# Patient Record
Sex: Female | Born: 1990 | Race: Black or African American | Hispanic: No | Marital: Single | Smoking: Current some day smoker
Health system: Southern US, Community
[De-identification: ages and names within clinical notes are randomized; demographics above are authoritative.]

## PROBLEM LIST (undated history)

## (undated) DIAGNOSIS — K219 Gastro-esophageal reflux disease without esophagitis: Secondary | ICD-10-CM

## (undated) DIAGNOSIS — K224 Dyskinesia of esophagus: Secondary | ICD-10-CM

## (undated) DIAGNOSIS — L309 Dermatitis, unspecified: Secondary | ICD-10-CM

---
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
## 2011-01-19 ENCOUNTER — Emergency Department (HOSPITAL_COMMUNITY)
Admission: EM | Admit: 2011-01-19 | Discharge: 2011-01-19 | Disposition: A | Discharge status: Home / Self Care | Payer: No Typology Code available for payment source | Attending: Emergency Medicine | Admitting: Emergency Medicine

## 2011-01-19 ENCOUNTER — Emergency Department (HOSPITAL_COMMUNITY): Payer: No Typology Code available for payment source

## 2011-01-19 DIAGNOSIS — R072 Precordial pain: Secondary | ICD-10-CM | POA: Insufficient documentation

## 2011-01-19 DIAGNOSIS — R0602 Shortness of breath: Secondary | ICD-10-CM | POA: Insufficient documentation

## 2012-11-02 ENCOUNTER — Encounter (HOSPITAL_COMMUNITY): Payer: Self-pay | Admitting: Emergency Medicine

## 2012-11-02 ENCOUNTER — Emergency Department (HOSPITAL_COMMUNITY)
Admission: EM | Admit: 2012-11-02 | Discharge: 2012-11-02 | Disposition: A | Discharge status: Home / Self Care | Payer: BC Managed Care – PPO | Source: Home / Self Care

## 2012-11-02 DIAGNOSIS — K224 Dyskinesia of esophagus: Secondary | ICD-10-CM

## 2012-11-02 DIAGNOSIS — Z3490 Encounter for supervision of normal pregnancy, unspecified, unspecified trimester: Secondary | ICD-10-CM

## 2012-11-02 DIAGNOSIS — O210 Mild hyperemesis gravidarum: Secondary | ICD-10-CM

## 2012-11-02 DIAGNOSIS — Z8719 Personal history of other diseases of the digestive system: Secondary | ICD-10-CM

## 2012-11-02 HISTORY — DX: Gastro-esophageal reflux disease without esophagitis: K21.9

## 2012-11-02 HISTORY — DX: Dyskinesia of esophagus: K22.4

## 2012-11-02 LAB — POCT URINALYSIS DIP (DEVICE)
Glucose, UA: NEGATIVE mg/dL
Specific Gravity, Urine: >=1.03 (ref 1.005–1.030)

## 2012-11-02 MED ORDER — PANTOPRAZOLE SODIUM 20 MG PO TBEC: 20.0000 mg | DELAYED_RELEASE_TABLET | Freq: Every day | ORAL | Status: DC

## 2012-11-02 MED ORDER — PRENATAL VITAMINS PLUS 27-1 MG PO TABS: 1.0000 | ORAL_TABLET | Freq: Every day | ORAL | Status: DC

## 2012-11-02 MED ORDER — ONDANSETRON HCL 4 MG PO TABS: 4.0000 mg | ORAL_TABLET | Freq: Four times a day (QID) | ORAL | Status: DC

## 2012-11-02 NOTE — ED Notes (Signed)
Pt c/o chest discomfort x6 weeks and abd pain x3 days She is [redacted] weeks pregnant Chest pain is intermittent; dx w/GERD and Esophagus spasms; given vicodin for the spasms and Prilosec for GERD Pain will last for few seconds  Abd pain is intermittent as well and will last for a few seconds Sx include: constipation x2 days when she'll usually have 2 bowel movements per day, vomiting, nauseas Has been able to keep Gatorade down  She is alert w/no signs of acute distress.

## 2012-11-02 NOTE — ED Provider Notes (Signed)
History     CSN: [id number]  Arrival date & time 11/02/12  1045   None     Chief Complaint  Patient presents with  . Chest Pain  . Abdominal Pain    (Consider location/radiation/quality/duration/timing/severity/associated sxs/prior treatment) HPI Comments: 22 year old female who is [redacted] weeks gestation and gravida 2 presents with nausea and vomiting for the past several days. She has had a poor intake of food but is able to drink most fluids. She does have nausea and she does not feel like drinking as much fluids as she knows she needs to. She is drinking Gatorade Pedialyte and other juices. She has a second complaint of substernal chest discomfort that is associated with her GERD. She was diagnosed with GERD 2 years ago with the same symptoms. She has not been taking her Prilosec for over 3 months. She denies shortness of breath or other associated symptoms. Her third complaint is a squeezing feeling in the upper lateral abdomen that is worse with vomiting. She has no pelvic pain, vaginal bleeding or discharge.   Past Medical History  Diagnosis Date  . Acid reflux   . Spasm of esophagus     History reviewed. No pertinent past surgical history.  No family history on file.  History  Substance Use Topics  . Smoking status: Never Smoker   . Smokeless tobacco: Not on file  . Alcohol Use: No    OB History    Grav Para Term Preterm Abortions TAB SAB Ect Mult Living                  Review of Systems  Constitutional: Positive for activity change. Negative for fever and fatigue.  HENT: Negative.   Respiratory: Negative.   Cardiovascular: Positive for chest pain.  Gastrointestinal: Positive for nausea, vomiting and constipation. Negative for diarrhea.       See history of present illness  Genitourinary: Negative.   Skin: Negative.   Neurological: Negative.     Allergies  Acrylic polymer; Naproxen; Penicillins; and Percocet  Home Medications   Current Outpatient Rx    Name  Route  Sig  Dispense  Refill  . VICODIN PO   Oral   Take by mouth.         Marland Kitchen PRILOSEC PO   Oral   Take by mouth.         . ONDANSETRON HCL 4 MG PO TABS   Oral   Take 1 tablet (4 mg total) by mouth every 6 (six) hours.   15 tablet   0   . PANTOPRAZOLE SODIUM 20 MG PO TBEC   Oral   Take 1 tablet (20 mg total) by mouth daily.   20 tablet   0   . PRENATAL VITAMINS PLUS 27-1 MG PO TABS   Oral   Take 1 tablet by mouth daily. 1 tab po once daily   30 tablet   1     BP 121/83  Pulse 85  Temp 98.1 F (36.7 C) (Oral)  Resp 16  SpO2 99%  LMP 09/20/2012  Physical Exam  Nursing note and vitals reviewed. Constitutional: She is oriented to person, place, and time. She appears well-developed and well-nourished. No distress.  Neck: Normal range of motion. Neck supple.  Cardiovascular: Normal rate, regular rhythm and normal heart sounds.        Split S2  Pulmonary/Chest: Effort normal and breath sounds normal. No respiratory distress. She has no wheezes. She has no rales.  No chest wall tenderness  Abdominal: Soft. Bowel sounds are normal. She exhibits no distension and no mass. There is no tenderness. There is no rebound and no guarding.  Musculoskeletal: Normal range of motion. She exhibits no edema.  Lymphadenopathy:    She has no cervical adenopathy.  Neurological: She is alert and oriented to person, place, and time. She exhibits normal muscle tone.  Skin: Skin is warm and dry. No rash noted.  Psychiatric: She has a normal mood and affect.    ED Course  Procedures (including critical care time)  Labs Reviewed  POCT URINALYSIS DIP (DEVICE) - Abnormal; Notable for the following:    Bilirubin Urine SMALL (*)     Ketones, ur TRACE (*)     Hgb urine dipstick MODERATE (*)     Protein, ur 30 (*)     Urobilinogen, UA 2.0 (*)     All other components within normal limits   No results found.   1. Esophageal spasm   2. H/O esophageal spasm   3.  Pregnancy   4. Hyperemesis gravidarum, mild, before 23rd week       MDM  Zofran 4 mg every 6 hours when necessary nausea Stop the Prilosec and start protonix 20 mg daily for esophageal spasms Prenatal vitamins one daily If not much better within 48 hours and  nausea and vomiting has not improved and  are unable to maintain your fluid intake you will need to [facility] for possible IV fluids. Force fluids as directed.        Hayden Rasmussen, NP 11/02/12 1408

## 2012-11-02 NOTE — ED Provider Notes (Signed)
Medical screening examination/treatment/procedure(s) were performed by resident physician or non-physician practitioner and as supervising physician I was immediately available for consultation/collaboration.   Barkley Bruns MD.    Linna Hoff, MD 11/02/12 2029

## 2013-02-20 IMAGING — CR DG CHEST 2V
2 series · 2 of 2 positions shown · non-contrast
Comparison: None.

CLINICAL DATA: Chest pain

CHEST - 2 VIEW

[w chest pa]
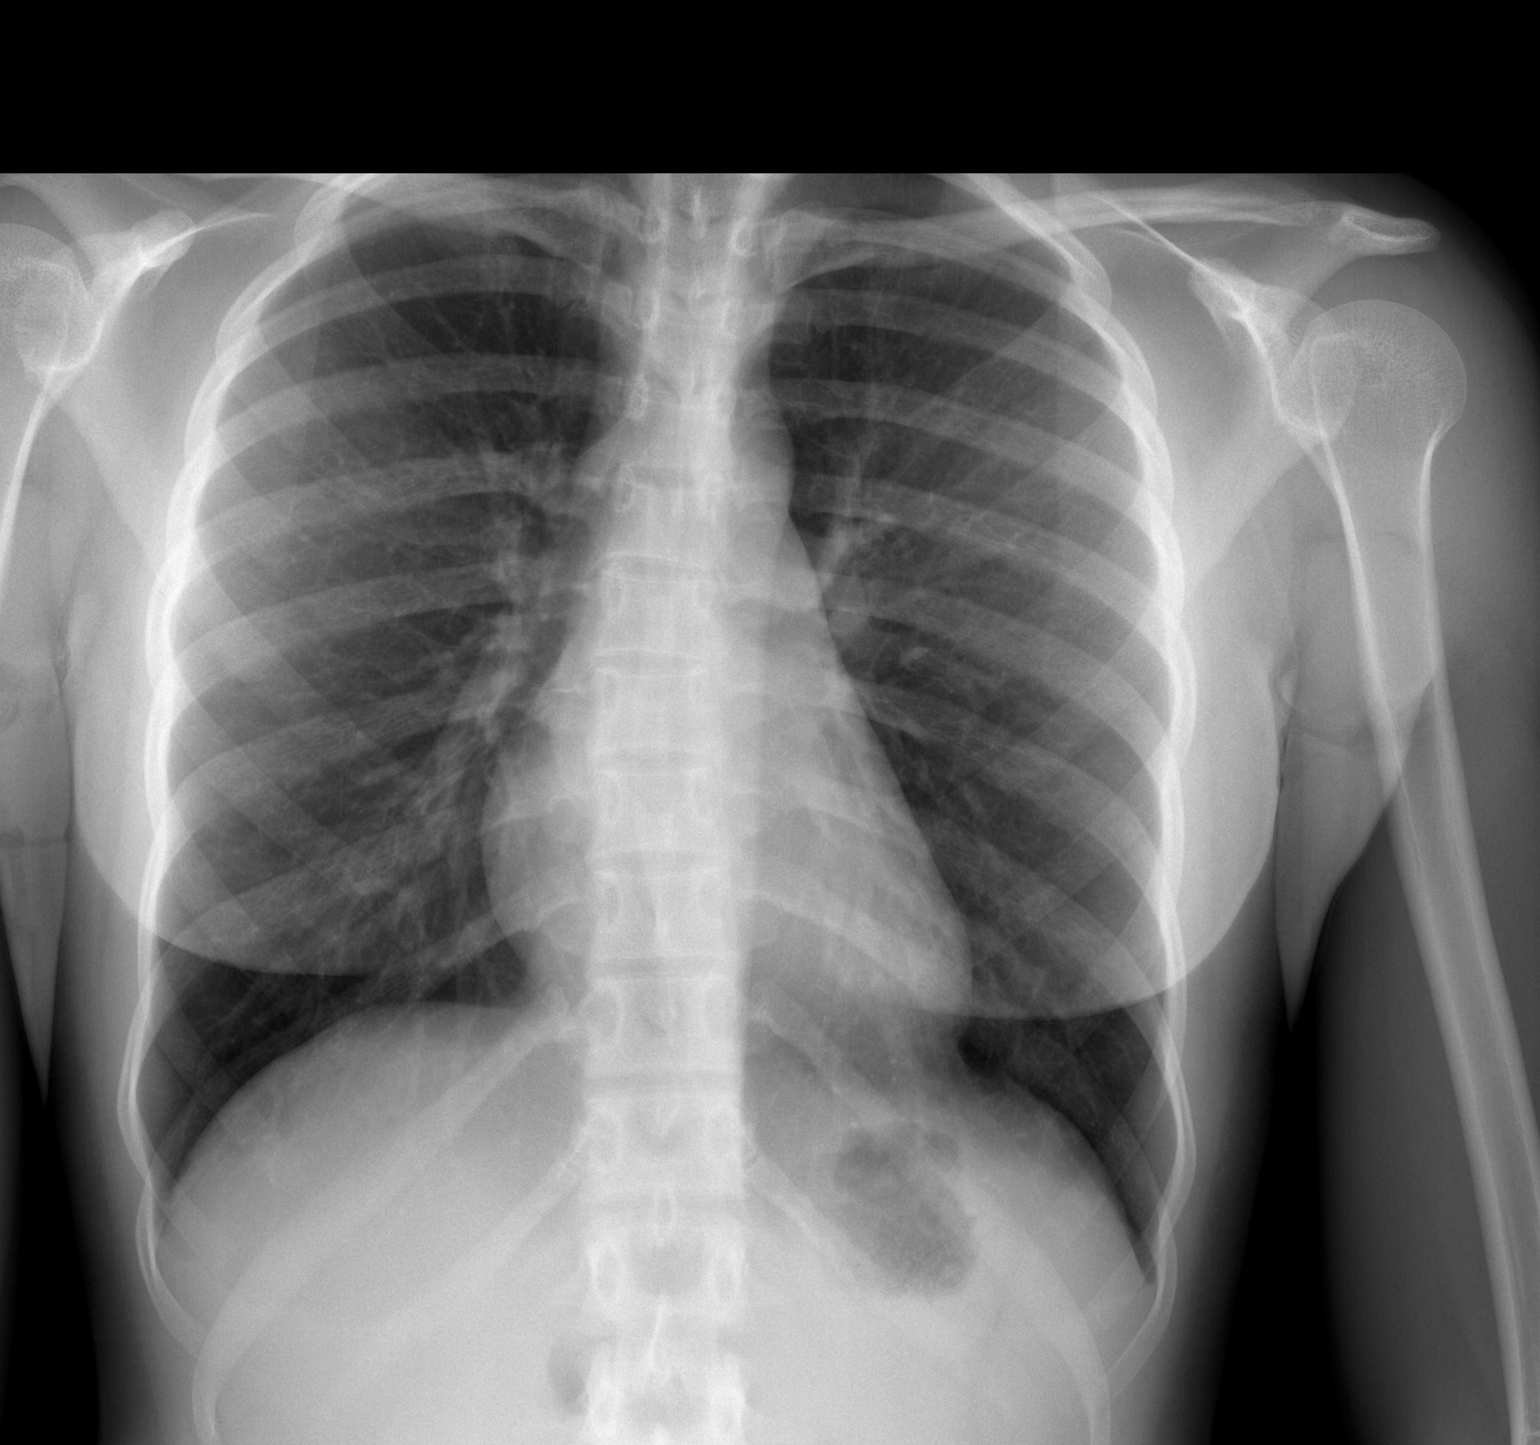

[w chest lat]
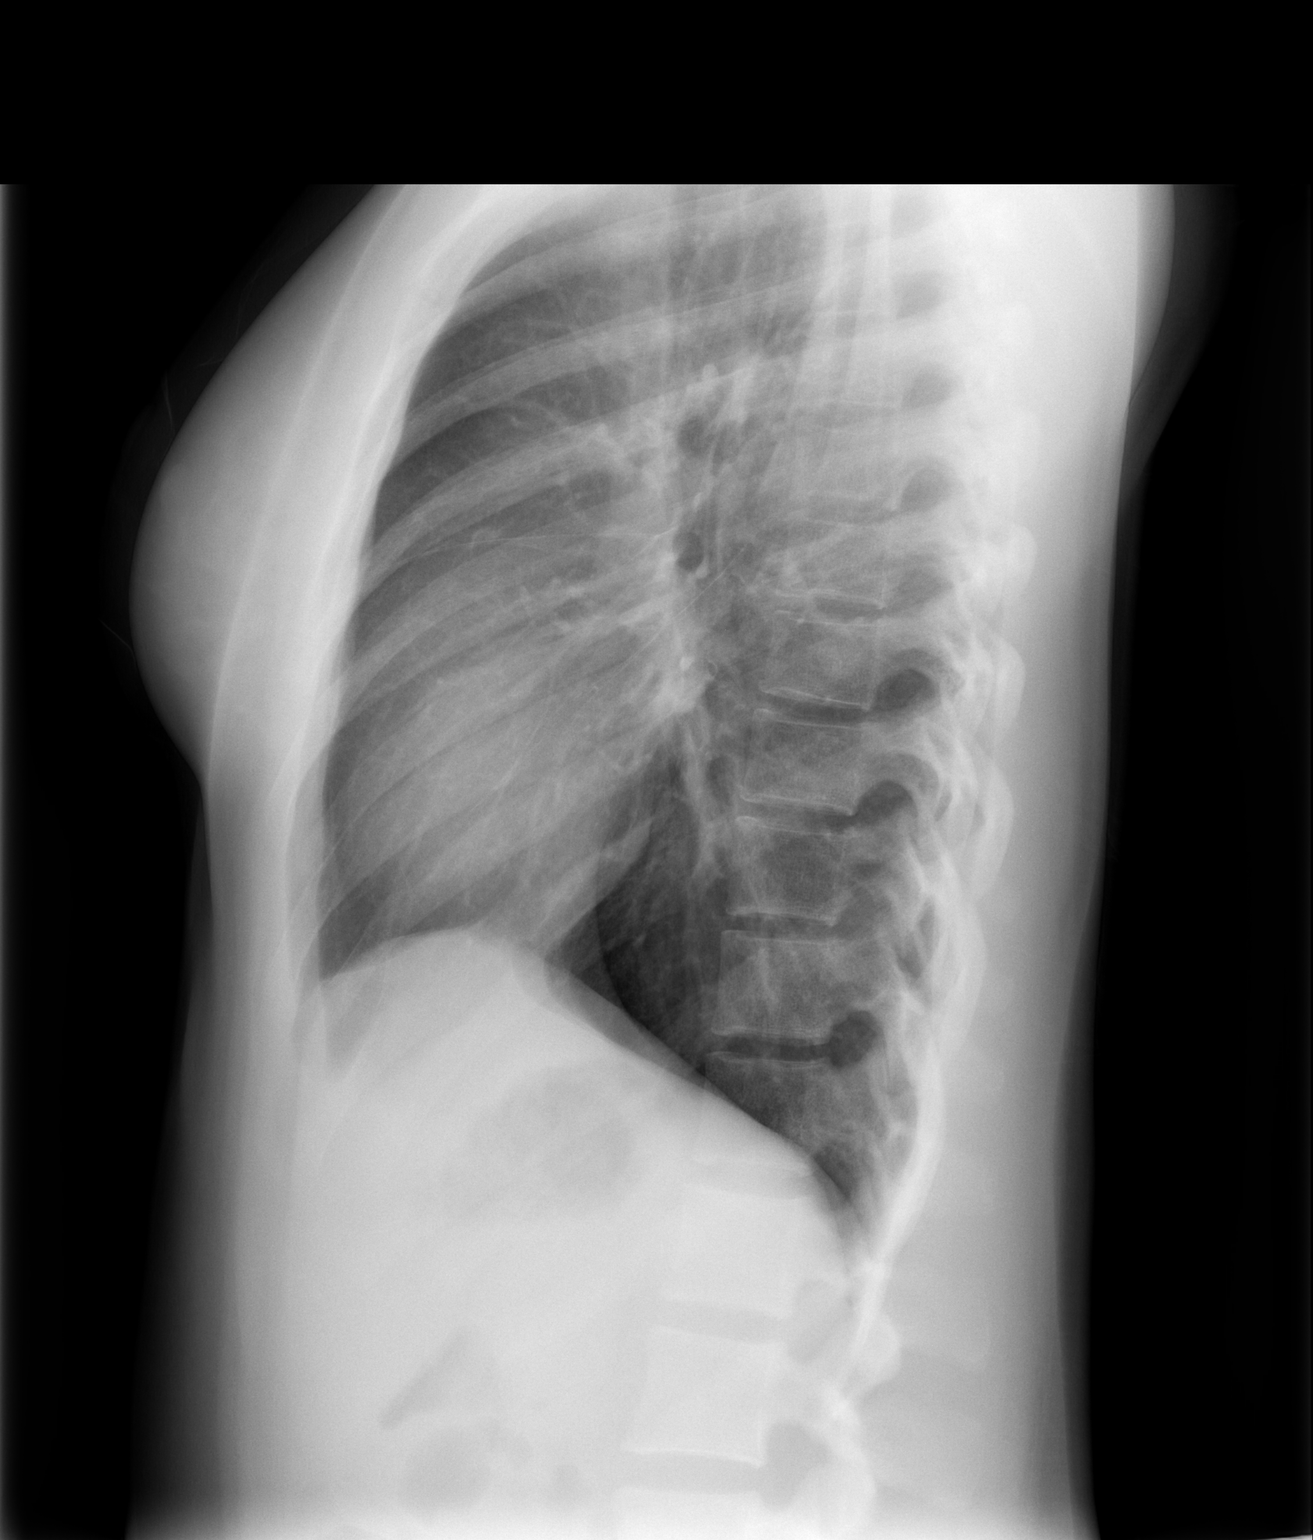

[2 of 2 positions shown; findings below may reference images not displayed]

FINDINGS: The lungs are clear without focal consolidation, edema,
effusion or pneumothorax.  Cardiopericardial silhouette is within
normal limits for size.  Imaged bony structures of the thorax are
intact.
IMPRESSION: Normal exam.

## 2013-03-07 ENCOUNTER — Encounter (HOSPITAL_COMMUNITY): Payer: Self-pay | Admitting: Emergency Medicine

## 2013-03-07 ENCOUNTER — Emergency Department (HOSPITAL_COMMUNITY)
Admission: EM | Admit: 2013-03-07 | Discharge: 2013-03-07 | Disposition: A | Discharge status: Home / Self Care | Payer: BC Managed Care – PPO | Attending: Emergency Medicine | Admitting: Emergency Medicine

## 2013-03-07 DIAGNOSIS — Z79899 Other long term (current) drug therapy: Secondary | ICD-10-CM | POA: Insufficient documentation

## 2013-03-07 DIAGNOSIS — L299 Pruritus, unspecified: Secondary | ICD-10-CM | POA: Insufficient documentation

## 2013-03-07 DIAGNOSIS — Z8719 Personal history of other diseases of the digestive system: Secondary | ICD-10-CM | POA: Insufficient documentation

## 2013-03-07 DIAGNOSIS — K219 Gastro-esophageal reflux disease without esophagitis: Secondary | ICD-10-CM | POA: Insufficient documentation

## 2013-03-07 DIAGNOSIS — R21 Rash and other nonspecific skin eruption: Secondary | ICD-10-CM | POA: Insufficient documentation

## 2013-03-07 DIAGNOSIS — T7840XA Allergy, unspecified, initial encounter: Secondary | ICD-10-CM

## 2013-03-07 DIAGNOSIS — Z880 Allergy status to penicillin: Secondary | ICD-10-CM | POA: Insufficient documentation

## 2013-03-07 MED ORDER — PREDNISONE 20 MG PO TABS: ORAL_TABLET | ORAL | Status: DC

## 2013-03-07 NOTE — ED Provider Notes (Signed)
Medical screening examination/treatment/procedure(s) were performed by non-physician practitioner and as supervising physician I was immediately available for consultation/collaboration.   Sweden Lesure, MD 03/07/13 1506 

## 2013-03-07 NOTE — ED Notes (Signed)
Pt c/o rash to right arm,face and neck x 1 day.

## 2013-03-07 NOTE — ED Provider Notes (Signed)
History     CSN: [id number]  Arrival date & time 03/07/13  1042   First MD Initiated Contact with Patient 03/07/13 1111      Chief Complaint  Patient presents with  . Rash    (Consider location/radiation/quality/duration/timing/severity/associated sxs/prior treatment) HPI Comments: Patient with history of "sensitive skin", eczema presents with itchy rash that began 24 hours ago. Patient states that she was exposed to a new detergent and thinks this is what caused her rash. Rash began on face and spread to upper left and right arms, neck, upper chest and back. No trouble breathing, lip swelling, wheezing. No treatments prior to arrival. Onset of symptoms gradual. Course is gradually worsening. Nothing makes symptoms better or worse.  Patient is a 22 y.o. female presenting with rash. The history is provided by the patient.  Rash Associated symptoms: no chest pain, no fever, no nausea, no shortness of breath and no vomiting     Past Medical History  Diagnosis Date  . Acid reflux   . Spasm of esophagus     History reviewed. No pertinent past surgical history.  History reviewed. No pertinent family history.  History  Substance Use Topics  . Smoking status: Never Smoker   . Smokeless tobacco: Not on file  . Alcohol Use: No    OB History   Grav Para Term Preterm Abortions TAB SAB Ect Mult Living                  Review of Systems  Constitutional: Negative for fever.  HENT: Negative for facial swelling and trouble swallowing.   Eyes: Negative for redness.  Respiratory: Negative for shortness of breath, wheezing and stridor.   Cardiovascular: Negative for chest pain.  Gastrointestinal: Negative for nausea and vomiting.  Musculoskeletal: Negative for myalgias.  Skin: Positive for rash.  Neurological: Negative for light-headedness.  Psychiatric/Behavioral: Negative for confusion.    Allergies  Naproxen; Penicillins; Percocet; and Acrylic polymer  Home Medications    Current Outpatient Rx  Name  Route  Sig  Dispense  Refill  . diphenhydrAMINE (BENADRYL) 25 MG tablet   Oral   Take 25 mg by mouth once.         . Omeprazole (PRILOSEC PO)   Oral   Take 1 tablet by mouth daily as needed (for stomach).          . predniSONE (DELTASONE) 20 MG tablet      3 Tabs PO Days 1-3, then 2 tabs PO Days 4-6, then 1 tab PO Day 7-9, then Half Tab PO Day 10-12   20 tablet   0     BP 122/86  Pulse 83  Temp(Src) 97.5 F (36.4 C) (Oral)  Resp 12  SpO2 99%  Physical Exam  Nursing note and vitals reviewed. Constitutional: She appears well-developed and well-nourished.  HENT:  Head: Normocephalic and atraumatic.  No angioedema.  Eyes: Conjunctivae are normal. Right eye exhibits no discharge. Left eye exhibits no discharge.  No conjunctival involvement.  Neck: Normal range of motion. Neck supple.  Cardiovascular: Normal rate, regular rhythm and normal heart sounds.   Pulmonary/Chest: Effort normal and breath sounds normal.  Abdominal: Soft. There is no tenderness.  Neurological: She is alert.  Skin: Skin is warm and dry.  Papules noted on face, neck, shoulders, upper arms, upper chest. No cellulitis or abscess.  Psychiatric: She has a normal mood and affect.    ED Course  Procedures (including critical care time)  Labs Reviewed - No  data to display No results found.   1. Allergic reaction, initial encounter    11:39 AM Patient seen and examined.   Vital signs reviewed and are as follows: Filed Vitals:   03/07/13 1105  BP: 122/86  Pulse: 83  Temp: 97.5 F (36.4 C)  Resp: 12   Counseled to watch her clothes and avoid things that exacerbate the rash.   MDM  Patients with rash, likely from new detergent exposure. No signs of anaphylaxis. No fever or systemic symptoms that would indicate infectious cause. No SJS, TEN.        Renne Crigler, PA-C 03/07/13 1143

## 2013-05-26 ENCOUNTER — Emergency Department (HOSPITAL_COMMUNITY)
Admission: EM | Admit: 2013-05-26 | Discharge: 2013-05-26 | Disposition: A | Discharge status: Home / Self Care | Payer: Self-pay | Attending: Emergency Medicine | Admitting: Emergency Medicine

## 2013-05-26 ENCOUNTER — Encounter (HOSPITAL_COMMUNITY): Payer: Self-pay | Admitting: Emergency Medicine

## 2013-05-26 DIAGNOSIS — Z888 Allergy status to other drugs, medicaments and biological substances status: Secondary | ICD-10-CM | POA: Insufficient documentation

## 2013-05-26 DIAGNOSIS — K219 Gastro-esophageal reflux disease without esophagitis: Secondary | ICD-10-CM | POA: Insufficient documentation

## 2013-05-26 DIAGNOSIS — T148XXA Other injury of unspecified body region, initial encounter: Secondary | ICD-10-CM

## 2013-05-26 DIAGNOSIS — Z885 Allergy status to narcotic agent status: Secondary | ICD-10-CM | POA: Insufficient documentation

## 2013-05-26 DIAGNOSIS — Z8719 Personal history of other diseases of the digestive system: Secondary | ICD-10-CM | POA: Insufficient documentation

## 2013-05-26 DIAGNOSIS — W268XXA Contact with other sharp object(s), not elsewhere classified, initial encounter: Secondary | ICD-10-CM | POA: Insufficient documentation

## 2013-05-26 DIAGNOSIS — IMO0002 Reserved for concepts with insufficient information to code with codable children: Secondary | ICD-10-CM | POA: Insufficient documentation

## 2013-05-26 DIAGNOSIS — Y93G1 Activity, food preparation and clean up: Secondary | ICD-10-CM | POA: Insufficient documentation

## 2013-05-26 DIAGNOSIS — Z79899 Other long term (current) drug therapy: Secondary | ICD-10-CM | POA: Insufficient documentation

## 2013-05-26 DIAGNOSIS — Y929 Unspecified place or not applicable: Secondary | ICD-10-CM | POA: Insufficient documentation

## 2013-05-26 DIAGNOSIS — Z880 Allergy status to penicillin: Secondary | ICD-10-CM | POA: Insufficient documentation

## 2013-05-26 MED ORDER — BUPIVACAINE HCL (PF) 0.5 % IJ SOLN: 30.0000 mL | Freq: Once | INTRAMUSCULAR | Status: DC

## 2013-05-26 MED ORDER — CEPHALEXIN 500 MG PO CAPS: 500.0000 mg | ORAL_CAPSULE | Freq: Four times a day (QID) | ORAL | Status: DC

## 2013-05-26 NOTE — ED Notes (Signed)
Patient presents to ED with c/o cut to her right hand index finger.

## 2013-05-26 NOTE — ED Provider Notes (Signed)
Medical screening examination/treatment/procedure(s) were performed by non-physician practitioner and as supervising physician I was immediately available for consultation/collaboration.  Brent Taillon L Eddie Koc, MD 05/26/13 1516 

## 2013-05-26 NOTE — ED Notes (Signed)
Small cut noted to posterior surface of L index finger.  Cut occurred last night with a can opener.  Pt does not recall when her last tetanus shot was. No bleeding from site. Slight swelling to knuckle noted.

## 2013-05-26 NOTE — ED Provider Notes (Signed)
CSN: [id number]     Arrival date & time 05/26/13  1039 History     First MD Initiated Contact with Patient 05/26/13 1114     Chief Complaint  Patient presents with  . Finger Injury   (Consider location/radiation/quality/duration/timing/severity/associated sxs/prior Treatment) HPI  Belinda Salas is a 22 y.o. female complaining of cut to right second digit occurring last night, patient was using a can opener. Is not clear but when her last tetanus shot was however she is in school now remembers getting shots from her primary care doctor prior to starting college. Patient states there is no pain, she denies any warmth or decreased range of motion  Past Medical History  Diagnosis Date  . Acid reflux   . Spasm of esophagus    History reviewed. No pertinent past surgical history. History reviewed. No pertinent family history. History  Substance Use Topics  . Smoking status: Never Smoker   . Smokeless tobacco: Not on file  . Alcohol Use: No   OB History   Grav Para Term Preterm Abortions TAB SAB Ect Mult Living                 Review of Systems 10 systems reviewed and found to be negative, except as noted in the HPI   Allergies  Naproxen; Penicillins; Percocet; and Acrylic polymer  Home Medications   Current Outpatient Rx  Name  Route  Sig  Dispense  Refill  . HYDROcodone-acetaminophen (NORCO/VICODIN) 5-325 MG per tablet   Oral   Take 1 tablet by mouth every 6 (six) hours as needed for pain.         Marland Kitchen omeprazole (PRILOSEC) 20 MG capsule   Oral   Take 20 mg by mouth daily.         . cephALEXin (KEFLEX) 500 MG capsule   Oral   Take 1 capsule (500 mg total) by mouth 4 (four) times daily.   20 capsule   0    BP 115/63  Pulse 67  Temp(Src) 98.4 F (36.9 C) (Oral)  Resp 18  SpO2 100%  LMP 05/05/2013 Physical Exam  Nursing note and vitals reviewed. Constitutional: She is oriented to person, place, and time. She appears well-developed and well-nourished.  No distress.  HENT:  Head: Normocephalic.  Eyes: Conjunctivae and EOM are normal.  Cardiovascular: Normal rate.   Pulmonary/Chest: Effort normal. No stridor.  Musculoskeletal: Normal range of motion.       Hands: Neurological: She is alert and oriented to person, place, and time.  Psychiatric: She has a normal mood and affect.    ED Course   Procedures (including critical care time)  Labs Reviewed - No data to display No results found. 1. Abrasion     MDM   Filed Vitals:   05/26/13 1050  BP: 115/63  Pulse: 67  Temp: 98.4 F (36.9 C)  TempSrc: Oral  Resp: 18  SpO2: 100%     HADLEA FURUYA is a 22 y.o. female small superficial abrasion to right second digit PIP. Full range of motion, no signs of infection. I will write her a when necessary prescription for Keflex and have advised her to return for red flags of infection  Medications  bupivacaine (MARCAINE) 0.5 % injection 30 mL (not administered)    Pt is hemodynamically stable, appropriate for, and amenable to discharge at this time. Pt verbalized understanding and agrees with care plan. All questions answered. Outpatient follow-up and specific return precautions discussed.  New Prescriptions   CEPHALEXIN (KEFLEX) 500 MG CAPSULE    Take 1 capsule (500 mg total) by mouth 4 (four) times daily.    Note: Portions of this report may have been transcribed using voice recognition software. Every effort was made to ensure accuracy; however, inadvertent computerized transcription errors may be present    Wynetta Emery, PA-C 05/26/13 1157

## 2013-12-12 ENCOUNTER — Encounter (HOSPITAL_COMMUNITY): Payer: Self-pay | Admitting: Emergency Medicine

## 2013-12-12 ENCOUNTER — Emergency Department (HOSPITAL_COMMUNITY)
Admission: EM | Admit: 2013-12-12 | Discharge: 2013-12-12 | Disposition: A | Discharge status: Home / Self Care | Payer: BC Managed Care – PPO | Attending: Emergency Medicine | Admitting: Emergency Medicine

## 2013-12-12 DIAGNOSIS — K5900 Constipation, unspecified: Secondary | ICD-10-CM | POA: Insufficient documentation

## 2013-12-12 DIAGNOSIS — K219 Gastro-esophageal reflux disease without esophagitis: Secondary | ICD-10-CM | POA: Insufficient documentation

## 2013-12-12 DIAGNOSIS — Z79899 Other long term (current) drug therapy: Secondary | ICD-10-CM | POA: Insufficient documentation

## 2013-12-12 DIAGNOSIS — Z792 Long term (current) use of antibiotics: Secondary | ICD-10-CM | POA: Insufficient documentation

## 2013-12-12 DIAGNOSIS — K644 Residual hemorrhoidal skin tags: Secondary | ICD-10-CM | POA: Insufficient documentation

## 2013-12-12 DIAGNOSIS — R0602 Shortness of breath: Secondary | ICD-10-CM | POA: Insufficient documentation

## 2013-12-12 DIAGNOSIS — Z880 Allergy status to penicillin: Secondary | ICD-10-CM | POA: Insufficient documentation

## 2013-12-12 DIAGNOSIS — Z872 Personal history of diseases of the skin and subcutaneous tissue: Secondary | ICD-10-CM | POA: Insufficient documentation

## 2013-12-12 HISTORY — DX: Dermatitis, unspecified: L30.9

## 2013-12-12 LAB — POC OCCULT BLOOD, ED: Fecal Occult Bld: NEGATIVE

## 2013-12-12 MED ORDER — HYDROCORTISONE 2.5 % RE CREA: TOPICAL_CREAM | RECTAL | Status: DC

## 2013-12-12 NOTE — ED Notes (Signed)
Pt c/o pain with bm and blood on toilet paper when she has a bowel movement.  Pt states she has been constipated for several weeks, but now is "regular".

## 2013-12-12 NOTE — Discharge Instructions (Signed)
Fiber Content in Foods Drinking plenty of fluids and consuming foods high in fiber can help with constipation. See the list below for the fiber content of some common foods. Starches and Grains / Dietary Fiber (g)  Cheerios, 1 cup / 3 g  Kellogg's Corn Flakes, 1 cup / 0.7 g  Rice Krispies, 1  cup / 0.3 g  Quaker Oat Life Cereal,  cup / 2.1 g  Oatmeal, instant (cooked),  cup / 2 g  Kellogg's Frosted Mini Wheats, 1 cup / 5.1 g  Rice, brown, long-grain (cooked), 1 cup / 3.5 g  Rice, white, long-grain (cooked), 1 cup / 0.6 g  Macaroni, cooked, enriched, 1 cup / 2.5 g Legumes / Dietary Fiber (g)  Beans, baked, canned, plain or vegetarian,  cup / 5.2 g  Beans, kidney, canned,  cup / 6.8 g  Beans, pinto, dried (cooked),  cup / 7.7 g  Beans, pinto, canned,  cup / 5.5 g Breads and Crackers / Dietary Fiber (g)  Graham crackers, plain or honey, 2 squares / 0.7 g  Saltine crackers, 3 squares / 0.3 g  Pretzels, plain, salted, 10 pieces / 1.8 g  Bread, whole-wheat, 1 slice / 1.9 g  Bread, white, 1 slice / 0.7 g  Bread, raisin, 1 slice / 1.2 g  Bagel, plain, 3 oz / 2 g  Tortilla, flour, 1 oz / 0.9 g  Tortilla, corn, 1 small / 1.5 g  Bun, hamburger or hotdog, 1 small / 0.9 g Fruits / Dietary Fiber (g)  Apple, raw with skin, 1 medium / 4.4 g  Applesauce, sweetened,  cup / 1.5 g  Banana,  medium / 1.5 g  Grapes, 10 grapes / 0.4 g  Orange, 1 small / 2.3 g  Raisin, 1.5 oz / 1.6 g  Melon, 1 cup / 1.4 g Vegetables / Dietary Fiber (g)  Green beans, canned,  cup / 1.3 g  Carrots (cooked),  cup / 2.3 g  Broccoli (cooked),  cup / 2.8 g  Peas, frozen (cooked),  cup / 4.4 g  Potatoes, mashed,  cup / 1.6 g  Lettuce, 1 cup / 0.5 g  Corn, canned,  cup / 1.6 g  Tomato,  cup / 1.1 g Document Released: 02/19/2007 Document Revised: 12/26/2011 Document Reviewed: 04/16/2007 [employer] Patient Information 2014 [employer] [place].  Hemorrhoids Hemorrhoids  are swollen veins around the rectum or anus. There are two types of hemorrhoids:   Internal hemorrhoids. These occur in the veins just inside the rectum. They may poke through to the outside and become irritated and painful.  External hemorrhoids. These occur in the veins outside the anus and can be felt as a painful swelling or hard lump near the anus. CAUSES  Pregnancy.   Obesity.   Constipation or diarrhea.   Straining to have a bowel movement.   Sitting for long periods on the toilet.  Heavy lifting or other activity that caused you to strain.  Anal intercourse. SYMPTOMS   Pain.   Anal itching or irritation.   Rectal bleeding.   Fecal leakage.   Anal swelling.   One or more lumps around the anus.  DIAGNOSIS  Your caregiver may be able to diagnose hemorrhoids by visual examination. Other examinations or tests that may be performed include:   Examination of the rectal area with a gloved hand (digital rectal exam).   Examination of anal canal using a small tube (scope).   A blood test if you have lost a significant  amount of blood. °· A test to look inside the colon (sigmoidoscopy or colonoscopy). °TREATMENT °Most hemorrhoids can be treated at home. However, if symptoms do not seem to be getting better or if you have a lot of rectal bleeding, your caregiver may perform a procedure to help make the hemorrhoids get smaller or remove them completely. Possible treatments include:  °· Placing a rubber band at the base of the hemorrhoid to cut off the circulation (rubber band ligation).   °· Injecting a chemical to shrink the hemorrhoid (sclerotherapy).   °· Using a tool to burn the hemorrhoid (infrared light therapy).   °· Surgically removing the hemorrhoid (hemorrhoidectomy).   °· Stapling the hemorrhoid to block blood flow to the tissue (hemorrhoid stapling).   °HOME CARE INSTRUCTIONS  °· Eat foods with fiber, such as whole grains, beans, nuts, fruits, and  vegetables. Ask your doctor about taking products with added fiber in them (fiber supplements). °· Increase fluid intake. Drink enough water and fluids to keep your urine clear or pale yellow.   °· Exercise regularly.   °· Go to the bathroom when you have the urge to have a bowel movement. Do not wait.   °· Avoid straining to have bowel movements.   °· Keep the anal area dry and clean. Use wet toilet paper or moist towelettes after a bowel movement.   °· Medicated creams and suppositories may be used or applied as directed.   °· Only take over-the-counter or prescription medicines as directed by your caregiver.   °· Take warm sitz baths for 15 20 minutes, 3 4 times a day to ease pain and discomfort.   °· Place ice packs on the hemorrhoids if they are tender and swollen. Using ice packs between sitz baths may be helpful.   °· Put ice in a plastic bag.   °· Place a towel between your skin and the bag.   °· Leave the ice on for 15 20 minutes, 3 4 times a day.   °· Do not use a donut-shaped pillow or sit on the toilet for long periods. This increases blood pooling and pain.   °SEEK MEDICAL CARE IF: °· You have increasing pain and swelling that is not controlled by treatment or medicine. °· You have uncontrolled bleeding. °· You have difficulty or you are unable to have a bowel movement. °· You have pain or inflammation outside the area of the hemorrhoids. °MAKE SURE YOU: °· Understand these instructions. °· Will watch your condition. °· Will get help right away if you are not doing well or get worse. °Document Released: 09/30/2000 Document Revised: 09/19/2012 Document Reviewed: 08/07/2012 °Exit[employer]® Patient Information ©2014 [employer]. ° °

## 2013-12-12 NOTE — ED Provider Notes (Signed)
CSN: [id number]     Arrival date & time 12/12/13  1800 History  This chart was scribed for non-physician practitioner Cherrie DistanceFrances Zaydee Aina, PA-C working with Shanna CiscoMegan E Docherty, MD by Danella Maiersaroline Early, ED Scribe. This patient was seen in room TR10C/TR10C and the patient's care was started at 6:11 PM.   Chief Complaint  Patient presents with  . Rectal Bleeding   HPI HPI Comments: Teodoro Spraylaetra K Mcnellis is a 23 y.o. female who presents to the Emergency Department complaining of intermittent rectal bleeding onset 2-3 days ago. Pt reports spots of blood on the toilet paper when she has BMs the last few days as well as pain with BMs. She reports "lumps" on her rectum that hurt and itch when she walks around. She states she was constipated for 1 week before she started having these symptoms. She denies pus. Her last BM was 1-2 hours ago. She denies abdominal pain, nausea, vomiting, vaginal discharge or bleeding, weakness, dizziness.   Past Medical History  Diagnosis Date  . Acid reflux   . Spasm of esophagus   . Eczema    History reviewed. No pertinent past surgical history. No family history on file. History  Substance Use Topics  . Smoking status: Never Smoker   . Smokeless tobacco: Not on file  . Alcohol Use: No   OB History   Grav Para Term Preterm Abortions TAB SAB Ect Mult Living                 Review of Systems  Respiratory: Positive for shortness of breath ("sometimes").   Gastrointestinal: Positive for constipation and anal bleeding. Negative for nausea, vomiting, abdominal pain and diarrhea.  Genitourinary: Negative for vaginal bleeding and vaginal discharge.  Neurological: Negative for dizziness and weakness.  All other systems reviewed and are negative.      Allergies  Naproxen; Penicillins; Percocet; and Acrylic polymer  Home Medications   Current Outpatient Rx  Name  Route  Sig  Dispense  Refill  . cephALEXin (KEFLEX) 500 MG capsule   Oral   Take 1 capsule (500 mg total)  by mouth 4 (four) times daily.   20 capsule   0   . HYDROcodone-acetaminophen (NORCO/VICODIN) 5-325 MG per tablet   Oral   Take 1 tablet by mouth every 6 (six) hours as needed for pain.         Marland Kitchen. omeprazole (PRILOSEC) 20 MG capsule   Oral   Take 20 mg by mouth daily.          BP 141/97  Pulse 96  Temp(Src) 98.2 F (36.8 C) (Oral)  Resp 20  Ht 5\' 2"  (1.575 m)  Wt 129 lb (58.514 kg)  BMI 23.59 kg/m2  SpO2 96%  LMP 11/29/2013 Physical Exam  Nursing note and vitals reviewed. Constitutional: She is oriented to person, place, and time. She appears well-developed and well-nourished. No distress.  HENT:  Head: Normocephalic and atraumatic.  Mouth/Throat: Oropharynx is clear and moist. No oropharyngeal exudate.  Eyes: Conjunctivae are normal. No scleral icterus.  Pulmonary/Chest: Effort normal.  Abdominal: Soft. Bowel sounds are normal. She exhibits no distension. There is no tenderness. There is no rebound and no guarding.  Genitourinary: Rectal exam shows external hemorrhoid. Rectal exam shows no fissure, no mass, no tenderness and anal tone normal. Guaiac negative stool.  Two external hemorrhoids noted at 3 and 6 o'clock, pink, pliable, no friability, no thrombosis noted.  Musculoskeletal: Normal range of motion. She exhibits no edema and no tenderness.  Neurological: She is alert and oriented to person, place, and time. She exhibits normal muscle tone. Coordination normal.  Skin: Skin is warm and dry. No rash noted. No erythema. No pallor.  Psychiatric: She has a normal mood and affect. Her behavior is normal. Judgment and thought content normal.    ED Course  Procedures (including critical care time) Medications - No data to display  DIAGNOSTIC STUDIES: Oxygen Saturation is 96% on RA, normal by my interpretation.    COORDINATION OF CARE: 6:24 PM- Discussed treatment plan with pt. Pt agrees to plan.    Medications - No data to display  Labs Review Labs Reviewed   OCCULT BLOOD X 1 CARD TO LAB, STOOL   Imaging Review No results found.  EKG Interpretation   None      Results for orders placed during the hospital encounter of 12/12/13  POC OCCULT BLOOD, ED      Result Value Ref Range   Fecal Occult Bld NEGATIVE  NEGATIVE   No results found.     MDM   External Hemorrhoid  Patient here with several episodes of BRBPR only when she wipes, noted with two external hemorrhoids on examination, no thrombosis, will give hemorrhoid cream and resources for PCP.  Patient understands and will follow up, no clinical concern for anemia and so blood work is deferred at this time.  I personally performed the services described in this documentation, which was scribed in my presence. The recorded information has been reviewed and is accurate.    Izola Price Marisue Humble, [place] 12/12/13 1844

## 2013-12-12 NOTE — ED Notes (Signed)
Pt comfortable with d/c and f/u instructions. Prescriptions x1 

## 2013-12-13 NOTE — ED Provider Notes (Signed)
Medical screening examination/treatment/procedure(s) were performed by non-physician practitioner and as supervising physician I was immediately available for consultation/collaboration.   Megan E Docherty, MD 12/13/13 1303 

## 2014-02-06 ENCOUNTER — Emergency Department (HOSPITAL_COMMUNITY)
Admission: EM | Admit: 2014-02-06 | Discharge: 2014-02-06 | Disposition: A | Discharge status: Home / Self Care | Payer: BC Managed Care – PPO | Attending: Emergency Medicine | Admitting: Emergency Medicine

## 2014-02-06 ENCOUNTER — Encounter (HOSPITAL_COMMUNITY): Payer: Self-pay | Admitting: Emergency Medicine

## 2014-02-06 DIAGNOSIS — Z8719 Personal history of other diseases of the digestive system: Secondary | ICD-10-CM | POA: Insufficient documentation

## 2014-02-06 DIAGNOSIS — S4980XA Other specified injuries of shoulder and upper arm, unspecified arm, initial encounter: Secondary | ICD-10-CM | POA: Insufficient documentation

## 2014-02-06 DIAGNOSIS — F17200 Nicotine dependence, unspecified, uncomplicated: Secondary | ICD-10-CM | POA: Insufficient documentation

## 2014-02-06 DIAGNOSIS — S40029A Contusion of unspecified upper arm, initial encounter: Secondary | ICD-10-CM

## 2014-02-06 DIAGNOSIS — S46909A Unspecified injury of unspecified muscle, fascia and tendon at shoulder and upper arm level, unspecified arm, initial encounter: Secondary | ICD-10-CM | POA: Insufficient documentation

## 2014-02-06 DIAGNOSIS — IMO0002 Reserved for concepts with insufficient information to code with codable children: Secondary | ICD-10-CM | POA: Insufficient documentation

## 2014-02-06 DIAGNOSIS — S40019A Contusion of unspecified shoulder, initial encounter: Secondary | ICD-10-CM | POA: Insufficient documentation

## 2014-02-06 DIAGNOSIS — Z880 Allergy status to penicillin: Secondary | ICD-10-CM | POA: Insufficient documentation

## 2014-02-06 DIAGNOSIS — Y92410 Unspecified street and highway as the place of occurrence of the external cause: Secondary | ICD-10-CM | POA: Insufficient documentation

## 2014-02-06 DIAGNOSIS — Z872 Personal history of diseases of the skin and subcutaneous tissue: Secondary | ICD-10-CM | POA: Insufficient documentation

## 2014-02-06 DIAGNOSIS — Y9389 Activity, other specified: Secondary | ICD-10-CM | POA: Insufficient documentation

## 2014-02-06 NOTE — ED Provider Notes (Signed)
Medical screening examination/treatment/procedure(s) were performed by non-physician practitioner and as supervising physician I was immediately available for consultation/collaboration.   EKG Interpretation None        Audree CamelScott T Isham Smitherman, MD 02/06/14 2351
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
## 2014-04-30 ENCOUNTER — Emergency Department (HOSPITAL_COMMUNITY)
Admission: EM | Admit: 2014-04-30 | Discharge: 2014-04-30 | Disposition: A | Discharge status: Home / Self Care | Payer: BC Managed Care – PPO | Attending: Emergency Medicine | Admitting: Emergency Medicine

## 2014-04-30 ENCOUNTER — Emergency Department (HOSPITAL_COMMUNITY): Payer: BC Managed Care – PPO

## 2014-04-30 ENCOUNTER — Encounter (HOSPITAL_COMMUNITY): Payer: Self-pay | Admitting: Emergency Medicine

## 2014-04-30 DIAGNOSIS — K219 Gastro-esophageal reflux disease without esophagitis: Secondary | ICD-10-CM | POA: Insufficient documentation

## 2014-04-30 DIAGNOSIS — R1013 Epigastric pain: Secondary | ICD-10-CM | POA: Insufficient documentation

## 2014-04-30 DIAGNOSIS — Z79899 Other long term (current) drug therapy: Secondary | ICD-10-CM | POA: Insufficient documentation

## 2014-04-30 DIAGNOSIS — Z3202 Encounter for pregnancy test, result negative: Secondary | ICD-10-CM | POA: Insufficient documentation

## 2014-04-30 DIAGNOSIS — Z880 Allergy status to penicillin: Secondary | ICD-10-CM | POA: Insufficient documentation

## 2014-04-30 DIAGNOSIS — Z872 Personal history of diseases of the skin and subcutaneous tissue: Secondary | ICD-10-CM | POA: Insufficient documentation

## 2014-04-30 DIAGNOSIS — F17200 Nicotine dependence, unspecified, uncomplicated: Secondary | ICD-10-CM | POA: Insufficient documentation

## 2014-04-30 LAB — CBC WITH DIFFERENTIAL/PLATELET
BASOS ABS: 0.1 10*3/uL (ref 0.0–0.1)
BASOS PCT: 1 % (ref 0–1)
EOS ABS: 0 10*3/uL (ref 0.0–0.7)
Eosinophils Relative: 1 % (ref 0–5)
HCT: 39.2 % (ref 36.0–46.0)
HEMOGLOBIN: 12.8 g/dL (ref 12.0–15.0)
Lymphocytes Relative: 61 % — ABNORMAL HIGH (ref 12–46)
Lymphs Abs: 2.7 10*3/uL (ref 0.7–4.0)
MCH: 26.4 pg (ref 26.0–34.0)
MCHC: 32.7 g/dL (ref 30.0–36.0)
MCV: 81 fL (ref 78.0–100.0)
MONOS PCT: 10 % (ref 3–12)
Monocytes Absolute: 0.4 10*3/uL (ref 0.1–1.0)
NEUTROS PCT: 27 % — AB (ref 43–77)
Neutro Abs: 1.1 10*3/uL — ABNORMAL LOW (ref 1.7–7.7)
Platelets: 268 10*3/uL (ref 150–400)
RBC: 4.84 MIL/uL (ref 3.87–5.11)
RDW: 14.5 % (ref 11.5–15.5)
WBC: 4.3 10*3/uL (ref 4.0–10.5)

## 2014-04-30 LAB — COMPREHENSIVE METABOLIC PANEL
ALBUMIN: 4.7 g/dL (ref 3.5–5.2)
ALT: 16 U/L (ref 0–35)
ANION GAP: 17 — AB (ref 5–15)
AST: 22 U/L (ref 0–37)
Alkaline Phosphatase: 85 U/L (ref 39–117)
BUN: 9 mg/dL (ref 6–23)
CO2: 22 mEq/L (ref 19–32)
CREATININE: 0.68 mg/dL (ref 0.50–1.10)
Calcium: 9.5 mg/dL (ref 8.4–10.5)
Chloride: 103 mEq/L (ref 96–112)
GFR calc Af Amer: >90 mL/min (ref >90)
GFR calc non Af Amer: >90 mL/min (ref >90)
Glucose, Bld: 68 mg/dL — ABNORMAL LOW (ref 70–99)
POTASSIUM: 3.7 meq/L (ref 3.7–5.3)
Sodium: 142 mEq/L (ref 137–147)
TOTAL PROTEIN: 8.9 g/dL — AB (ref 6.0–8.3)
Total Bilirubin: 1 mg/dL (ref 0.3–1.2)

## 2014-04-30 LAB — PREGNANCY, URINE: Preg Test, Ur: NEGATIVE

## 2014-04-30 LAB — URINALYSIS, ROUTINE W REFLEX MICROSCOPIC
Bilirubin Urine: NEGATIVE
Glucose, UA: NEGATIVE mg/dL
Ketones, ur: NEGATIVE mg/dL
LEUKOCYTES UA: NEGATIVE
NITRITE: NEGATIVE
Protein, ur: NEGATIVE mg/dL
SPECIFIC GRAVITY, URINE: 1.024 (ref 1.005–1.030)
UROBILINOGEN UA: 0.2 mg/dL (ref 0.0–1.0)
pH: 8 (ref 5.0–8.0)

## 2014-04-30 LAB — URINE MICROSCOPIC-ADD ON

## 2014-04-30 LAB — LIPASE, BLOOD: LIPASE: 30 U/L (ref 11–59)

## 2014-04-30 MED ORDER — GI COCKTAIL ~~LOC~~
30.0000 mL | Freq: Once | ORAL | Status: AC
  Administered 2014-04-30: 30 mL via ORAL
  Filled 2014-04-30: qty 30

## 2014-04-30 MED ORDER — FAMOTIDINE 20 MG PO TABS: 20.0000 mg | ORAL_TABLET | Freq: Two times a day (BID) | ORAL | Status: AC

## 2014-04-30 MED ORDER — SODIUM CHLORIDE 0.9 % IV BOLUS (SEPSIS)
1000.0000 mL | Freq: Once | INTRAVENOUS | Status: AC
  Administered 2014-04-30: 1000 mL via INTRAVENOUS

## 2014-04-30 NOTE — ED Notes (Signed)
VSS. NAD noted. IV taken out. Discharge instructions given, prescriptions reviewed. All questions answered. Pt ambulatory on discharge.

## 2014-04-30 NOTE — ED Notes (Signed)
Pt in c/o epigastric abd pain over the last 3-4 months, symptoms are worse after eating, denies n/v, states symptoms are getting progressively worse, no distress noted

## 2014-04-30 NOTE — ED Notes (Signed)
Notified Marissa, PA to update pt on plan of care.

## 2014-04-30 NOTE — ED Provider Notes (Signed)
CSN: [id number]     Arrival date & time 04/30/14  1243 History   None    Chief Complaint  Patient presents with  . Abdominal Pain     (Consider location/radiation/quality/duration/timing/severity/associated sxs/prior Treatment) The history is provided by the patient. No language interpreter was used.  Belinda Salas is a 23 y/o F with PMHx of Acid Reflux, spasm of the esophagus, ezcema presenting to the ED with epigastric discomfort that has been ongoing for the past 3-4 months. Patient reported that the pain is intermittent described as a sharp pain that radiates across the upper aspect of her abdomen. Stated that the pain worsens with laying down flat, as well as eating - stated that soda, coffee, and fast foods make the pain worse. Patient reported that the pain is intermittent and associated with eating - stated that the pain is an underlying dull, gnawing pain. Stated that she has been using Prilosec, but stated that she has not used this medication in over a month. Denied alcohol use. Stated that she has normal BMs - reported that her last one was this morning. Denied spicy/fatty foods - stated that she has been cooking herself for the past 3 months. Denied chest pain, shortness of breath, difficulty breathing, hemoptysis, hematemesis, melena, hematochezia, weakness, numbness, tingling, headache, dizziness, pelvic pain, vaginal pain, vaginal discharge. Patient is currently on Depo-provera.  PCP none   Past Medical History  Diagnosis Date  . Acid reflux   . Spasm of esophagus   . Eczema    History reviewed. No pertinent past surgical history. History reviewed. No pertinent family history. History  Substance Use Topics  . Smoking status: Current Some Day Smoker  . Smokeless tobacco: Not on file  . Alcohol Use: No   OB History   Grav Para Term Preterm Abortions TAB SAB Ect Mult Living                 Review of Systems  Constitutional: Negative for fever and chills.    Respiratory: Negative for chest tightness and shortness of breath.   Cardiovascular: Negative for chest pain.  Gastrointestinal: Positive for abdominal pain. Negative for nausea, vomiting, diarrhea, constipation, blood in stool and anal bleeding.  Genitourinary: Negative for dysuria, hematuria, flank pain, vaginal bleeding, vaginal discharge, vaginal pain and pelvic pain.  Neurological: Negative for dizziness, weakness and headaches.      Allergies  Naproxen; Penicillins; Percocet; and Acrylic polymer  Home Medications   Prior to Admission medications   Medication Sig Start Date End Date Taking? Authorizing Provider  medroxyPROGESTERone (DEPO-PROVERA) 150 MG/ML injection Inject 150 mg into the muscle every 3 (three) months.  11/29/13  Yes Historical Provider, MD  Melatonin 3 MG CAPS Take 3 mg by mouth at bedtime as needed (sleep).   Yes Historical Provider, MD  omeprazole (PRILOSEC) 20 MG capsule Take 20 mg by mouth as needed (GERD).   Yes Historical Provider, MD  famotidine (PEPCID) 20 MG tablet Take 1 tablet (20 mg total) by mouth 2 (two) times daily. 04/30/14   Willa Brocks, PA-C   BP 117/73  Pulse 50  Temp(Src) 98.2 F (36.8 C) (Oral)  Resp 17  Ht 5\' 1"  (1.549 m)  Wt 135 lb (61.236 kg)  BMI 25.52 kg/m2  SpO2 100% Physical Exam  Nursing note and vitals reviewed. Constitutional: She is oriented to person, place, and time. She appears well-developed and well-nourished. No distress.  Patient resting comfortably in bed, sitting upright in no sign of distress  HENT:  Head: Normocephalic and atraumatic.  Mouth/Throat: Oropharynx is clear and moist. No oropharyngeal exudate.  Eyes: Conjunctivae and EOM are normal. Pupils are equal, round, and reactive to light. Right eye exhibits no discharge. Left eye exhibits no discharge.  Neck: Normal range of motion. Neck supple. No tracheal deviation present.  Negative neck stiffness Negative nuchal rigidity  Negative cervical  lymphadenopathy  Negative meningeal signs  Cardiovascular: Normal rate, regular rhythm and normal heart sounds.  Exam reveals no friction rub.   No murmur heard. Cap refill < 3 seconds Negative swelling or pitting edema noted to the lower extremities bilaterally   Pulmonary/Chest: Effort normal and breath sounds normal. No respiratory distress. She has no wheezes. She has no rales.  Patient is able to speak in full sentences without difficulty Negative use of accessory muscles Negative stridor  Abdominal: Normal appearance and bowel sounds are normal. She exhibits no distension. There is tenderness in the right upper quadrant and epigastric area. There is no rebound, no guarding and no CVA tenderness.    Negative abdominal distension  BS normoactive in all 4 quadrants Abdominal pain upon palpation to the epigastric region and right upper quadrant  Negative peritoneal signs Negative rigidity or guarding noted  Musculoskeletal: Normal range of motion.  Full ROM to upper and lower extremities without difficulty noted, negative ataxia noted.  Lymphadenopathy:    She has no cervical adenopathy.  Neurological: She is alert and oriented to person, place, and time. No cranial nerve deficit. She exhibits normal muscle tone. Coordination normal.  Cranial nerves III-XII grossly intact Strength 5+/5+ to upper and lower extremities bilaterally with resistance applied, equal distribution noted Negative facial drooping Negative slurred speech  Negative aphasia   Skin: Skin is warm and dry. No rash noted. She is not diaphoretic. No erythema.  Psychiatric: She has a normal mood and affect. Her behavior is normal. Thought content normal.    ED Course  Procedures (including critical care time)  Results for orders placed during the hospital encounter of 04/30/14  CBC WITH DIFFERENTIAL      Result Value Ref Range   WBC 4.3  4.0 - 10.5 K/uL   RBC 4.84  3.87 - 5.11 MIL/uL   Hemoglobin 12.8  12.0 -  15.0 g/dL   HCT 81.1  91.4 - 78.2 %   MCV 81.0  78.0 - 100.0 fL   MCH 26.4  26.0 - 34.0 pg   MCHC 32.7  30.0 - 36.0 g/dL   RDW 95.6  21.3 - 08.6 %   Platelets 268  150 - 400 K/uL   Neutrophils Relative % 27 (*) 43 - 77 %   Neutro Abs 1.1 (*) 1.7 - 7.7 K/uL   Lymphocytes Relative 61 (*) 12 - 46 %   Lymphs Abs 2.7  0.7 - 4.0 K/uL   Monocytes Relative 10  3 - 12 %   Monocytes Absolute 0.4  0.1 - 1.0 K/uL   Eosinophils Relative 1  0 - 5 %   Eosinophils Absolute 0.0  0.0 - 0.7 K/uL   Basophils Relative 1  0 - 1 %   Basophils Absolute 0.1  0.0 - 0.1 K/uL  COMPREHENSIVE METABOLIC PANEL      Result Value Ref Range   Sodium 142  137 - 147 mEq/L   Potassium 3.7  3.7 - 5.3 mEq/L   Chloride 103  96 - 112 mEq/L   CO2 22  19 - 32 mEq/L   Glucose, Bld 68 (*)  70 - 99 mg/dL   BUN 9  6 - 23 mg/dL   Creatinine, Ser 1.610.68  0.50 - 1.10 mg/dL   Calcium 9.5  8.4 - 09.610.5 mg/dL   Total Protein 8.9 (*) 6.0 - 8.3 g/dL   Albumin 4.7  3.5 - 5.2 g/dL   AST 22  0 - 37 U/L   ALT 16  0 - 35 U/L   Alkaline Phosphatase 85  39 - 117 U/L   Total Bilirubin 1.0  0.3 - 1.2 mg/dL   GFR calc non Af Amer >90  >90 mL/min   GFR calc Af Amer >90  >90 mL/min   Anion gap 17 (*) 5 - 15  LIPASE, BLOOD      Result Value Ref Range   Lipase 30  11 - 59 U/L  URINALYSIS, ROUTINE W REFLEX MICROSCOPIC      Result Value Ref Range   Color, Urine YELLOW  YELLOW   APPearance HAZY (*) CLEAR   Specific Gravity, Urine 1.024  1.005 - 1.030   pH 8.0  5.0 - 8.0   Glucose, UA NEGATIVE  NEGATIVE mg/dL   Hgb urine dipstick LARGE (*) NEGATIVE   Bilirubin Urine NEGATIVE  NEGATIVE   Ketones, ur NEGATIVE  NEGATIVE mg/dL   Protein, ur NEGATIVE  NEGATIVE mg/dL   Urobilinogen, UA 0.2  0.0 - 1.0 mg/dL   Nitrite NEGATIVE  NEGATIVE   Leukocytes, UA NEGATIVE  NEGATIVE  URINE MICROSCOPIC-ADD ON      Result Value Ref Range   Squamous Epithelial / LPF FEW (*) RARE   WBC, UA 0-2  <3 WBC/hpf   RBC / HPF 21-50  <3 RBC/hpf   Bacteria, UA FEW (*)  RARE  PREGNANCY, URINE      Result Value Ref Range   Preg Test, Ur NEGATIVE  NEGATIVE    Labs Review Labs Reviewed  CBC WITH DIFFERENTIAL - Abnormal; Notable for the following:    Neutrophils Relative % 27 (*)    Neutro Abs 1.1 (*)    Lymphocytes Relative 61 (*)    All other components within normal limits  COMPREHENSIVE METABOLIC PANEL - Abnormal; Notable for the following:    Glucose, Bld 68 (*)    Total Protein 8.9 (*)    Anion gap 17 (*)    All other components within normal limits  URINALYSIS, ROUTINE W REFLEX MICROSCOPIC - Abnormal; Notable for the following:    APPearance HAZY (*)    Hgb urine dipstick LARGE (*)    All other components within normal limits  URINE MICROSCOPIC-ADD ON - Abnormal; Notable for the following:    Squamous Epithelial / LPF FEW (*)    Bacteria, UA FEW (*)    All other components within normal limits  LIPASE, BLOOD  PREGNANCY, URINE  POC URINE PREG, ED    Imaging Review [place] Abdomen Complete  04/30/2014   CLINICAL DATA:  Right upper quadrant and epigastric discomfort  EXAM: ULTRASOUND ABDOMEN COMPLETE  COMPARISON:  None.  FINDINGS: Gallbladder:  No gallstones or wall thickening visualized. No sonographic Murphy sign noted.  Common bile duct:  Diameter: 2.6 mm  Liver:  No focal lesion identified. Within normal limits in parenchymal echogenicity.  IVC:  No abnormality visualized.  Pancreas:  Visualized portion unremarkable.  Spleen:  Size and appearance within normal limits.  Right Kidney:  Length: 9.5 cm. Echogenicity within normal limits. No mass or hydronephrosis visualized.  Left Kidney:  Length: 9.4 cm. Echogenicity within normal limits. No mass  or hydronephrosis visualized.  Abdominal aorta:  No aneurysm visualized.  Other findings:  There is no ascites.  IMPRESSION: Normal abdominal ultrasound examination.   Electronically Signed   By: David  [place]   On: 04/30/2014 16:26     EKG Interpretation None      MDM   Final diagnoses:    Gastroesophageal reflux disease, esophagitis presence not specified    Medications  sodium chloride 0.9 % bolus 1,000 mL (0 mLs Intravenous Stopped 04/30/14 1827)  gi cocktail (Maalox,Lidocaine,Donnatal) (30 mLs Oral Given 04/30/14 1828)   Filed Vitals:   04/30/14 1830 04/30/14 1900 04/30/14 1930 04/30/14 2030  BP: 123/73 126/80 115/80 117/73  Pulse:  81 62 50  Temp:      TempSrc:      Resp: 16 20 17 17   Height:      Weight:      SpO2:  100% 100% 100%   CBC negative elevated white blood cell count-negative left shift or leukocytosis noted. CMP negative findings-electrolytes properly balanced. BUN and creatinine elevation-BUN 9, creatinine 0.68. Lipase negative elevation. Hemoglobin large with negative nitrites, leukocytes or ketones. Urine pregnancy negative. Abdominal ultrasound negative findings-no gallstones or wall thickening visualized. No focal lesions identified in the liver. Pancreas visualized portion is unremarkable. Doubt appendicitis. Doubt pancreatitis. Doubt cholangitis/cholecystitis. Doubt acute abdominal processes. Abdomen soft, - nonsurgical abdomen noted. Suspicion to be acid reflux-GERD. Patient responded well to GI cocktail-pain relieved. Patient stable, afebrile. Patient not septic appearing. Discharged patient. [facility]. Patient has been seen by case management with numerous primary care provider resources given. Patient discharged with Pepcid. Discussed with patient to rest and stay hydrated-discussed proper diet. Discussed with patient to closely monitor symptoms and if symptoms are to worsen or change to report back to the ED - strict return instructions given.  Patient agreed to plan of care, understood, all questions answered.   Raymon Mutton, PA-C 05/01/14 [phone]

## 2014-04-30 NOTE — Discharge Instructions (Signed)
Please call your doctor for a followup appointment within 24-48 hours. When you talk to your doctor please let them know that you were seen in the emergency department and have them acquire all of your records so that they can discuss the findings with you and formulate a treatment plan to fully care for your new and ongoing problems. Please call and set-up an appointment with [facility] Please call and set-up an appointment with Gastroenterology Please continue to rest and stay hydrated Please avoid coffee, carbonated drinks, spicy foods, and fast foods Please continue to monitor symptom closely and if symptoms are to worsen or change (fever greater than 101, chills, sweating, nausea, vomiting, chest pain, shortness of breath, difficulty breathing, numbness, tingling, worsening or changes to pain pattern, coughing up blood, blood in the stool, black tarry stool, weakness, blood in vomit) please report back to the ED immediately   Gastroesophageal Reflux Disease, Adult Gastroesophageal reflux disease (GERD) happens when acid from your stomach flows up into the esophagus. When acid comes in contact with the esophagus, the acid causes soreness (inflammation) in the esophagus. Over time, GERD may create small holes (ulcers) in the lining of the esophagus. CAUSES   Increased body weight. This puts pressure on the stomach, making acid rise from the stomach into the esophagus.  Smoking. This increases acid production in the stomach.  Drinking alcohol. This causes decreased pressure in the lower esophageal sphincter (valve or ring of muscle between the esophagus and stomach), allowing acid from the stomach into the esophagus.  Late evening meals and a full stomach. This increases pressure and acid production in the stomach.  A malformed lower esophageal sphincter. Sometimes, no cause is found. SYMPTOMS   Burning pain in the lower part of the mid-chest behind the breastbone and in the  mid-stomach area. This may occur twice a week or more often.  Trouble swallowing.  Sore throat.  Dry cough.  Asthma-like symptoms including chest tightness, shortness of breath, or wheezing. DIAGNOSIS  Your caregiver may be able to diagnose GERD based on your symptoms. In some cases, X-rays and other tests may be done to check for complications or to check the condition of your stomach and esophagus. TREATMENT  Your caregiver may recommend over-the-counter or prescription medicines to help decrease acid production. Ask your caregiver before starting or adding any new medicines.  HOME CARE INSTRUCTIONS   Change the factors that you can control. Ask your caregiver for guidance concerning weight loss, quitting smoking, and alcohol consumption.  Avoid foods and drinks that make your symptoms worse, such as:  Caffeine or alcoholic drinks.  Chocolate.  Peppermint or mint flavorings.  Garlic and onions.  Spicy foods.  Citrus fruits, such as oranges, lemons, or limes.  Tomato-based foods such as sauce, chili, salsa, and pizza.  Fried and fatty foods.  Avoid lying down for the 3 hours prior to your bedtime or prior to taking a nap.  Eat small, frequent meals instead of large meals.  Wear loose-fitting clothing. Do not wear anything tight around your waist that causes pressure on your stomach.  Raise the head of your bed 6 to 8 inches with wood blocks to help you sleep. Extra pillows will not help.  Only take over-the-counter or prescription medicines for pain, discomfort, or fever as directed by your caregiver.  Do not take aspirin, ibuprofen, or other nonsteroidal anti-inflammatory drugs (NSAIDs). SEEK IMMEDIATE MEDICAL CARE IF:   You have pain in your arms, neck, jaw, teeth,  or back.  Your pain increases or changes in intensity or duration.  You develop nausea, vomiting, or sweating (diaphoresis).  You develop shortness of breath, or you faint.  Your vomit is green,  yellow, black, or looks like coffee grounds or blood.  Your stool is red, bloody, or black. These symptoms could be signs of other problems, such as heart disease, gastric bleeding, or esophageal bleeding. MAKE SURE YOU:   Understand these instructions.  Will watch your condition.  Will get help right away if you are not doing well or get worse. Document Released: 07/13/2005 Document Revised: 12/26/2011 Document Reviewed: 04/22/2011 [facility] Patient Information 2015 [place], [place]. This information is not intended to replace advice given to you by your health care provider. Make sure you discuss any questions you have with your health care provider.  Food Choices for Gastroesophageal Reflux Disease When you have gastroesophageal reflux disease (GERD), the foods you eat and your eating habits are very important. Choosing the right foods can help ease the discomfort of GERD. WHAT GENERAL GUIDELINES DO I NEED TO FOLLOW?  Choose fruits, vegetables, whole grains, low-fat dairy products, and low-fat meat, fish, and poultry.  Limit fats such as oils, salad dressings, butter, nuts, and avocado.  Keep a food diary to identify foods that cause symptoms.  Avoid foods that cause reflux. These may be different for different people.  Eat frequent small meals instead of three large meals each day.  Eat your meals slowly, in a relaxed setting.  Limit fried foods.  Cook foods using methods other than frying.  Avoid drinking alcohol.  Avoid drinking large amounts of liquids with your meals.  Avoid bending over or lying down until 2-3 hours after eating. WHAT FOODS ARE NOT RECOMMENDED? The following are some foods and drinks that may worsen your symptoms: Vegetables Tomatoes. Tomato juice. Tomato and spaghetti sauce. Chili peppers. Onion and garlic. Horseradish. Fruits Oranges, grapefruit, and lemon (fruit and juice). Meats High-fat meats, fish, and poultry. This includes hot dogs, ribs,  ham, sausage, salami, and bacon. Dairy Whole milk and chocolate milk. Sour cream. Cream. Butter. Ice cream. Cream cheese.  Beverages Coffee and tea, with or without caffeine. Carbonated beverages or energy drinks. Condiments Hot sauce. Barbecue sauce.  Sweets/Desserts Chocolate and cocoa. Donuts. Peppermint and spearmint. Fats and Oils High-fat foods, including [place] fries and potato chips. Other Vinegar. Strong spices, such as black pepper, white pepper, red pepper, cayenne, curry powder, cloves, ginger, and chili powder. The items listed above may not be a complete list of foods and beverages to avoid. Contact your dietitian for more information. Document Released: 10/03/2005 Document Revised: 10/08/2013 Document Reviewed: 08/07/2013 [facility] Patient Information 2015 [place], [place]. This information is not intended to replace advice given to you by your health care provider. Make sure you discuss any questions you have with your health care provider.   Emergency Department Resource Guide 1) Find a Doctor and Pay Out of Pocket Although you won't have to find out who is covered by your insurance plan, it is a good idea to ask around and get recommendations. You will then need to call the office and see if the doctor you have chosen will accept you as a new patient and what types of options they offer for patients who are self-pay. Some doctors offer discounts or will set up payment plans for their patients who do not have insurance, but you will need to ask so you aren't surprised when you get to your appointment.  2) Contact Your Local Health Department Not all health departments have doctors that can see patients for sick visits, but many do, so it is worth a call to see if yours does. If you don't know where your local health department is, you can check in your phone book. The CDC also has a tool to help you locate your state's health department, and many state websites also have  listings of all of their local health departments.  3) Find a Walk-in Clinic If your illness is not likely to be very severe or complicated, you may want to try a walk in clinic. These are popping up all over the country in pharmacies, drugstores, and shopping centers. They're usually staffed by nurse practitioners or physician assistants that have been trained to treat common illnesses and complaints. They're usually fairly quick and inexpensive. However, if you have serious medical issues or chronic medical problems, these are probably not your best option.  No Primary Care Doctor: - Call Health Connect at  [phone] - they can help you locate a primary care doctor that  accepts your insurance, provides certain services, etc. - Physician Referral Service- [phone]  Chronic Pain Problems: Organization         Address  Phone   Notes  [facility]  [phone] Patients need to be referred by their primary care doctor.   Medication Assistance: Organization         Address  Phone   Notes  [facility] Medication [facility] [address] [place], [place] [address] [phone]) [phone] --Must be a resident of [place] -- Must have NO insurance coverage whatsoever (no Medicaid/ Medicare, etc.) -- The pt. MUST have a primary care doctor that directs their care regularly and follows them in the community   Med[facility]ist  [phone]   [employer]  [phone]    Agencies that provide inexpensive medical care: Organization         Address  Phone   Notes  [facility]  [phone]   [facility]    [phone]   [facility] [address] [place], [place] [address] [phone]   [facility] [address], [place] [phone]   [facility]anned [facility]    [phone]   [facility]    [phone]   [facility]  [address], [place] Phone:  [phone], Fax:  [phone]) [phone] Hours of Operation:  9 am - 6 pm, M-F.  Also accepts Medicaid/Medicare and self-pay.  [facility]  [address], [place] Phone: [phone], Fax: [phone]) [phone]. Hours of Operation:  8:30 am - 5:30 pm, M-F.  Also accepts Medicaid and self-pay.  [facility] [place] [address], [place] Phone: [phone]) [phone]   [facility] [address], [place], [place] [phone], Ext. [phone] Mondays & Thursdays: 7-9 AM.  First 15 patients are seen on a first come, first serve basis.    Medicaid-accepting [facility] Providers:  Organization         Address  Phone   Notes  [facility] [address], [place] [phone]) [phone] Also accepts self-pay patients.  [facility] [address], [place]  [phone]) [phone]   [facility] [address]  [place], [place] [phone]   [facility] [address] [place] [phone]   Renaye Rakers [address], [place]   [phone] Only accepts [place] Access [place] patients after they have their name applied to their card.   Self-Pay (no insurance) in [facility]:  Organization         Address  Phone   Notes  Sickle Cell Patients, [facility] [address] [place], [place] [phone]   [facility] [address] [place], [place] [phone]   [facility]  [address], [place] [phone]   [facility]/Dr. Osei-Bonsu  [address] [place] or [address], [place] [phone] Phone number for both [place] and [place] locations is the same.  [facility] [address] [place] [phone]   [facility] [address]  [place] or [address] [phone]   [facility] [address] [place] [phone], phone; [phone], fax Sees patients 1st and 3rd Saturday of every month.  Must not qualify for public or private insurance (i.e. Medicaid, Medicare, [place] Health Choice, Veterans' Benefits)  Household income should be no more than 200% of the poverty level The clinic cannot treat you if you are pregnant or think you are pregnant  Sexually transmitted diseases are not treated at the clinic.    Dental Care: Organization         Address  Phone  Notes  [facility] [address] [place], [place] [phone] Accepts children up to age 80 who are enrolled in [place] or [place] Health Choice; pregnant women with a Medicaid card; and children who have applied for Medicaid or [place] Health Choice, but were declined, whose parents can pay a reduced fee at time of service.  [facility]  [address], [place] [phone] Accepts children up to age 99 who are enrolled in [place] or [place] Health Choice; pregnant women with a Medicaid card; and children who have applied for Medicaid or [place] Health Choice, but were declined, whose parents can pay a reduced fee at time of service.  [facility]  [address] [place], [place] [phone] Patients are seen by appointment only. Walk-ins are not accepted. [facility] will see patients 40 years of age and older. Monday - Tuesday (8am-5pm) Most Wednesdays (8:30-5pm) $30 per visit, cash only  [facility] Adult Dental Access PROGRAM  [address], [facility] [phone] Patients are seen by appointment only. Walk-ins are not accepted. [facility] will see patients 41 years of age and older. One Wednesday Evening (Monthly: Volunteer Based).  $30 per visit, cash only  [employer]  [phone] for adults; Children under age 78, call Graduate Pediatric Dentistry at [phone]. Children aged 36-14, please call [phone] to request a pediatric application.  Dental services are provided in all areas of dental care including fillings, crowns and bridges, complete and partial dentures, implants, gum treatment, root canals, and extractions. Preventive care is also provided. Treatment is provided to both adults and children. Patients are selected via a lottery and there is often a waiting list.   [facility] [address],  [place]  [phone] [email or web address]   [facility] [address] [place], [place] [phone], Ext. [phone] Second and Fourth Thursday of each month, opens at 6:30 AM; Clinic ends at 9 AM.  Patients are seen on a first-come first-served basis, and a limited number are seen during each clinic.   [facility]  [address] [place], [place] [phone]   Eligibility Requirements You must have lived in [place], [place], or [place] counties for at least the last three months.   You cannot be eligible for state or federal sponsored [place], including [employer], [place], or [employer].   You generally cannot be eligible for healthcare insurance through your employer.    How to apply: Eligibility screenings are held every Tuesday and Wednesday afternoon from 1:00 pm until 4:00 pm. You do not need an appointment for the interview!  [facility] [address], [place], [place] [phone]   [facility] Health [facility]  [phone]   [facility]  [phone]   [facility]  [phone]    Behavioral Health Resources in the Community: Intensive Outpatient Programs Organization         Address  Phone  Notes  [facility] [address], [place], [place] [phone]     [facility] Outpatient [address] [place], [place] [phone]   [facility] [address], [place], [place]  [phone]   [facility] [address],  [place], [place] [phone] or [phone]   Substance Abuse Resources Organization         Address  Phone  Notes  [facility]  [phone]   [facility]  [phone]   [facility]  [phone]   Floydene Flock  [phone]   Residential & Outpatient Substance Abuse Program  [phone]   Psychological Services Organization         Address  Phone  Notes  [facility]  [phone]   [facility]  [phone]   [facility] [address], [place] [phone] or [phone]    Mobile Crisis Teams Organization         Address  Phone  Notes  [facility], Mobile Crisis Care Unit  [phone]   [facility]  [address] [place], [place] [phone]   Doristine Locks [address] [place] [phone]    Self-Help/Support Groups Organization         Address  Phone             Notes  [facility] [place] - variety of support groups  336- I7437963 Call for more information  Narcotics Anonymous (NA), [facility] [address], [employer] [place]  2 meetings at this location   Statistician         Address  Phone  Notes  [facility] [address],    [place]  [phone]   [facility]  [address] [place] [address], [place], [place] [phone]   [facility] [address] [place], [place] [phone] Admissions: 8am-3pm M-F  [facility] [address],    [place], [place] [phone]   [facility] [address] [place], [place], [place] [phone]   [facility] [address]  [place],  [place] [phone]  [facility] - Intensive Outpatient [address], [place], [place] [phone]   °[facility] ([facility] [address]  °[place], [place] [phone] or [phone]   °[facility] ([facility]) [address] [place], [place] [phone] Accepts Medicaid  °[facility] [address]  °[place] 1-800-659-3381 Substance Abuse/Addiction Treatment  ° °[place] Behavioral Health Resources °Organization         Address  Phone  Notes  °[facility]  (888) [phone]   °Julie Brannon, PhD [address] [place], [place]   ([phone]) [phone] or (336) 951-0000   °[facility]   [address] °[place], [place] ([phone]) [phone]   °[facility] [address], [place], [place] ([phone]) [phone] Insurance/Medicaid/sponsorship through [facility]  °[facility] [address]                                    [place], [place] [phone]) [phone] Therapy/tele-psych/case  °[facility] [address]  ° [place], [place] ([phone]) [phone]    °Dr. Arfeen  ([phone]) [phone]   °[facility] [place]  [employer] [place] [facility] 1) [address], [place] °2) [address], [place] °3)  [address], [place] ([phone]) [phone] °(336) [phone] ° °(336) [phone]   °[place] Child Abuse Hotline (336) 342-1394 or (336) 342-3537 (After Hours)    ° ° ° °

## 2014-04-30 NOTE — Progress Notes (Signed)
ED CM consulted by Carlynn Purl PA-C concerning establishing care for f/u. Pt noted to have Belinda Salas, without a PCP. Pt presented to [facility] ED for abdominal pains. Met with patient at bedside, information confirmed. Pt reports having BCBS through school. Pt states she needs assistance finding a PCP. Patient given list of PCP accepting new patients in the the area to contact. Pt verbalized appreciation for the assistance.  Discuss plan with M. Sciacca PA-C  She is agreeable with plan. No further ED CM needs identified.

## 2014-05-01 NOTE — ED Provider Notes (Signed)
Medical screening examination/treatment/procedure(s) were performed by non-physician practitioner and as supervising physician I was immediately available for consultation/collaboration.   EKG Interpretation None        Laray AngerKathleen M Jaye Saal, DO 05/01/14 1407

## 2015-02-24 ENCOUNTER — Encounter (HOSPITAL_COMMUNITY): Payer: Self-pay | Admitting: Physical Medicine and Rehabilitation

## 2015-02-24 ENCOUNTER — Emergency Department (HOSPITAL_COMMUNITY)
Admission: EM | Admit: 2015-02-24 | Discharge: 2015-02-24 | Disposition: A | Discharge status: Home / Self Care | Payer: BLUE CROSS/BLUE SHIELD | Attending: Emergency Medicine | Admitting: Emergency Medicine

## 2015-02-24 DIAGNOSIS — Z880 Allergy status to penicillin: Secondary | ICD-10-CM | POA: Insufficient documentation

## 2015-02-24 DIAGNOSIS — Z720 Tobacco use: Secondary | ICD-10-CM | POA: Insufficient documentation

## 2015-02-24 DIAGNOSIS — Z79899 Other long term (current) drug therapy: Secondary | ICD-10-CM | POA: Insufficient documentation

## 2015-02-24 DIAGNOSIS — R238 Other skin changes: Secondary | ICD-10-CM | POA: Insufficient documentation

## 2015-02-24 DIAGNOSIS — Z872 Personal history of diseases of the skin and subcutaneous tissue: Secondary | ICD-10-CM | POA: Diagnosis not present

## 2015-02-24 DIAGNOSIS — K219 Gastro-esophageal reflux disease without esophagitis: Secondary | ICD-10-CM | POA: Diagnosis not present

## 2015-02-24 DIAGNOSIS — R21 Rash and other nonspecific skin eruption: Secondary | ICD-10-CM | POA: Diagnosis present

## 2015-02-24 MED ORDER — SULFAMETHOXAZOLE-TRIMETHOPRIM 800-160 MG PO TABS
1.0000 | ORAL_TABLET | Freq: Once | ORAL | Status: AC
  Administered 2015-02-24: 1 via ORAL
  Filled 2015-02-24: qty 1

## 2015-02-24 MED ORDER — CLINDAMYCIN PHOSPHATE 1 % EX GEL: Freq: Two times a day (BID) | CUTANEOUS | Status: AC

## 2015-02-24 MED ORDER — SULFAMETHOXAZOLE-TRIMETHOPRIM 800-160 MG PO TABS: 1.0000 | ORAL_TABLET | Freq: Two times a day (BID) | ORAL | Status: AC

## 2015-02-24 NOTE — Discharge Instructions (Signed)
Take Bactrim as directed until gone. Use Clindamycin gel as needed. Refer to attached documents for more information.

## 2015-02-24 NOTE — ED Notes (Signed)
Pt states she started 2 days ago with rash on her face and has now spread to entire body. Rash is papular, non-itching. Pt reports that she has already had chicken pox.

## 2015-02-24 NOTE — ED Provider Notes (Signed)
CSN: [id number]     Arrival date & time 02/24/15  0840 History  This chart was scribed for Emilia BeckKaitlyn Mortimer Bair, PA-C working with Bethann BerkshireJoseph Zammit, MD by Evon Slackerrance Branch, ED Scribe. This patient was seen in room TR05C/TR05C and the patient's care was started at 9:06 AM.     Chief Complaint  Patient presents with  . Rash   The history is provided by the patient. No language interpreter was used.   HPI Comments: Belinda Salas is a 24 y.o. female with PMHx of Eczema who presents to the Emergency Department complaining of worsening red facial rash that began 2 days ago. Pt states that the rash has now spread to the rest of her body including her torso, arms, and legs. Pt has tried benadryl, aloe vera gel, and hydrocortisone cream with no relief. Denies any new soaps or detergents. Pt denies any new medications. Pt denies the rash being itchy or painful. Pt denies fever, nausea, vomiting or diarrhea.   Past Medical History  Diagnosis Date  . Acid reflux   . Spasm of esophagus   . Eczema    History reviewed. No pertinent past surgical history. No family history on file. History  Substance Use Topics  . Smoking status: Current Some Day Smoker    Types: Cigarettes  . Smokeless tobacco: Not on file  . Alcohol Use: No   OB History    No data available      Review of Systems  Constitutional: Negative for fever.  Gastrointestinal: Negative for nausea, vomiting and diarrhea.  Skin: Positive for rash. Negative for wound.  All other systems reviewed and are negative.    Allergies  Naproxen; Penicillins; Percocet; and Acrylic polymer  Home Medications   Prior to Admission medications   Medication Sig Start Date End Date Taking? Authorizing Provider  famotidine (PEPCID) 20 MG tablet Take 1 tablet (20 mg total) by mouth 2 (two) times daily. 04/30/14   Marissa Sciacca, PA-C  medroxyPROGESTERone (DEPO-PROVERA) 150 MG/ML injection Inject 150 mg into the muscle every 3 (three) months.   11/29/13   Historical Provider, MD  Melatonin 3 MG CAPS Take 3 mg by mouth at bedtime as needed (sleep).    Historical Provider, MD  omeprazole (PRILOSEC) 20 MG capsule Take 20 mg by mouth as needed (GERD).    Historical Provider, MD   BP 131/84 mmHg  Pulse 77  Temp(Src) 98.7 F (37.1 C) (Oral)  Resp 18  SpO2 100%   Physical Exam  Constitutional: She is oriented to person, place, and time. She appears well-developed and well-nourished. No distress.  HENT:  Head: Normocephalic and atraumatic.  Eyes: Conjunctivae and EOM are normal.  Neck: Normal range of motion. Neck supple. No tracheal deviation present.  Cardiovascular: Normal rate.   Pulmonary/Chest: Effort normal. No respiratory distress.  Abdominal: Soft. She exhibits no distension. There is no tenderness. There is no rebound.  Musculoskeletal: Normal range of motion.  Neurological: She is alert and oriented to person, place, and time. Coordination normal.  Skin: Skin is warm and dry. Rash noted. Rash is papular.  Scattered papules of face, chest, arms, back and legs. No open wounds or excoriations.   Psychiatric: She has a normal mood and affect. Her behavior is normal.  Nursing note and vitals reviewed.   ED Course  Procedures (including critical care time) DIAGNOSTIC STUDIES: Oxygen Saturation is 100% on RA, normal by my interpretation.    COORDINATION OF CARE: 9:51 AM-Discussed treatment plan with pt at bedside  and pt agreed to plan.    Labs Review Labs Reviewed - No data to display  Imaging Review No results found.   EKG Interpretation None      MDM   Final diagnoses:  Skin papules, generalized    Patient's rash appears to be bacterial in nature. The rash has the appearance of acne. Patient will be treated with bactrim and topical clindamycin. Vitals stable and patient afebrile. Patient is well appearing. I doubt systemic bacterial infection at this time.   I personally performed the services described  in this documentation, which was scribed in my presence. The recorded information has been reviewed and is accurate.     Emilia BeckKaitlyn Oronde Hallenbeck, PA-C 02/24/15 1201  Bethann BerkshireJoseph Zammit, MD 02/24/15 [phone]

## 2015-02-24 NOTE — ED Notes (Signed)
Pt presents to department for evaluation of rash all over body since Sunday, pt reports itching and discomfort. Respirations unlabored. NAD

## 2015-02-25 ENCOUNTER — Emergency Department (HOSPITAL_COMMUNITY)
Admission: EM | Admit: 2015-02-25 | Discharge: 2015-02-25 | Disposition: A | Discharge status: Home / Self Care | Payer: BLUE CROSS/BLUE SHIELD | Attending: Emergency Medicine | Admitting: Emergency Medicine

## 2015-02-25 ENCOUNTER — Encounter (HOSPITAL_COMMUNITY): Payer: Self-pay | Admitting: Emergency Medicine

## 2015-02-25 DIAGNOSIS — R21 Rash and other nonspecific skin eruption: Secondary | ICD-10-CM | POA: Diagnosis present

## 2015-02-25 DIAGNOSIS — Z3202 Encounter for pregnancy test, result negative: Secondary | ICD-10-CM | POA: Insufficient documentation

## 2015-02-25 DIAGNOSIS — Z720 Tobacco use: Secondary | ICD-10-CM | POA: Insufficient documentation

## 2015-02-25 DIAGNOSIS — Z79899 Other long term (current) drug therapy: Secondary | ICD-10-CM | POA: Insufficient documentation

## 2015-02-25 DIAGNOSIS — Z880 Allergy status to penicillin: Secondary | ICD-10-CM | POA: Insufficient documentation

## 2015-02-25 DIAGNOSIS — B09 Unspecified viral infection characterized by skin and mucous membrane lesions: Secondary | ICD-10-CM | POA: Diagnosis not present

## 2015-02-25 DIAGNOSIS — L309 Dermatitis, unspecified: Secondary | ICD-10-CM

## 2015-02-25 DIAGNOSIS — K219 Gastro-esophageal reflux disease without esophagitis: Secondary | ICD-10-CM | POA: Insufficient documentation

## 2015-02-25 LAB — POC URINE PREG, ED: Preg Test, Ur: NEGATIVE

## 2015-02-25 MED ORDER — METHYLPREDNISOLONE SODIUM SUCC 125 MG IJ SOLR
60.0000 mg | Freq: Once | INTRAMUSCULAR | Status: AC
  Administered 2015-02-25: 60 mg via INTRAMUSCULAR
  Filled 2015-02-25: qty 2

## 2015-02-25 MED ORDER — PREDNISONE 20 MG PO TABS: ORAL_TABLET | ORAL | Status: DC

## 2015-02-25 MED ORDER — PREDNISONE 20 MG PO TABS: ORAL_TABLET | ORAL | Status: AC

## 2015-02-25 NOTE — ED Notes (Signed)
Per patient, states rash appeared on Sunday by Tuesday it had spread from face to forearms, now on back, torso

## 2015-02-25 NOTE — Discharge Instructions (Signed)
Please call your doctor for a followup appointment within 24-48 hours. When you talk to your doctor please let them know that you were seen in the emergency department and have them acquire all of your records so that they can discuss the findings with you and formulate a treatment plan to fully care for your new and ongoing problems. Please follow-up with her primary care provider Please rest and stay hydrated Please follow-up with health and wellness Center Please follow-up with dermatologist Please discontinue clindamycin gel and Bactrim Please take medication as prescribed Please take into consideration was placed on your body and anterior body Please continue to monitor symptoms closely and if symptoms are to worsen or change (fever greater than 101, chills, sweating, nausea, vomiting, chest pain, shortness of breathe, difficulty breathing, weakness, numbness, tingling, worsening or changes to pain pattern, swelling, red streaks, pus drainage, bleeding, throat closing sensation, tongue swelling, cough, worsening or changes to rash) please report back to the Emergency Department immediately.   Contact Dermatitis Contact dermatitis is a reaction to certain substances that touch the skin. Contact dermatitis can be either irritant contact dermatitis or allergic contact dermatitis. Irritant contact dermatitis does not require previous exposure to the substance for a reaction to occur.Allergic contact dermatitis only occurs if you have been exposed to the substance before. Upon a repeat exposure, your body reacts to the substance.  CAUSES  Many substances can cause contact dermatitis. Irritant dermatitis is most commonly caused by repeated exposure to mildly irritating substances, such as:  Makeup.  Soaps.  Detergents.  Bleaches.  Acids.  Metal salts, such as nickel. Allergic contact dermatitis is most commonly caused by exposure to:  Poisonous plants.  Chemicals (deodorants,  shampoos).  Jewelry.  Latex.  Neomycin in triple antibiotic cream.  Preservatives in products, including clothing. SYMPTOMS  The area of skin that is exposed may develop:  Dryness or flaking.  Redness.  Cracks.  Itching.  Pain or a burning sensation.  Blisters. With allergic contact dermatitis, there may also be swelling in areas such as the eyelids, mouth, or genitals.  DIAGNOSIS  Your caregiver can usually tell what the problem is by doing a physical exam. In cases where the cause is uncertain and an allergic contact dermatitis is suspected, a patch skin test may be performed to help determine the cause of your dermatitis. TREATMENT Treatment includes protecting the skin from further contact with the irritating substance by avoiding that substance if possible. Barrier creams, powders, and gloves may be helpful. Your caregiver may also recommend:  Steroid creams or ointments applied 2 times daily. For best results, soak the rash area in cool water for 20 minutes. Then apply the medicine. Cover the area with a plastic wrap. You can store the steroid cream in the refrigerator for a "chilly" effect on your rash. That may decrease itching. Oral steroid medicines may be needed in more severe cases.  Antibiotics or antibacterial ointments if a skin infection is present.  Antihistamine lotion or an antihistamine taken by mouth to ease itching.  Lubricants to keep moisture in your skin.  Burow's solution to reduce redness and soreness or to dry a weeping rash. Mix one packet or tablet of solution in 2 cups cool water. Dip a clean washcloth in the mixture, wring it out a bit, and put it on the affected area. Leave the cloth in place for 30 minutes. Do this as often as possible throughout the day.  Taking several cornstarch or baking soda baths daily  if the area is too large to cover with a washcloth. Harsh chemicals, such as alkalis or acids, can cause skin damage that is like a burn.  You should flush your skin for 15 to 20 minutes with cold water after such an exposure. You should also seek immediate medical care after exposure. Bandages (dressings), antibiotics, and pain medicine may be needed for severely irritated skin.  HOME CARE INSTRUCTIONS  Avoid the substance that caused your reaction.  Keep the area of skin that is affected away from hot water, soap, sunlight, chemicals, acidic substances, or anything else that would irritate your skin.  Do not scratch the rash. Scratching may cause the rash to become infected.  You may take cool baths to help stop the itching.  Only take over-the-counter or prescription medicines as directed by your caregiver.  See your caregiver for follow-up care as directed to make sure your skin is healing properly. SEEK MEDICAL CARE IF:   Your condition is not better after 3 days of treatment.  You seem to be getting worse.  You see signs of infection such as swelling, tenderness, redness, soreness, or warmth in the affected area.  You have any problems related to your medicines. Document Released: 09/30/2000 Document Revised: 12/26/2011 Document Reviewed: 03/08/2011 [facility] Patient Information 2015 [place], [place]. This information is not intended to replace advice given to you by your health care provider. Make sure you discuss any questions you have with your health care provider.  Viral Exanthems, Adult Many viral infections of the skin are called viral exanthems. Exanthem is another name for a rash or skin eruption. The most common viral exanthems include the following:  [place] measles or rubella.  Measles or rubeola.  Roseola.  Parvovirus B19 (Erythema infectiosum or Fifth disease).  Chickenpox or varicella. DIAGNOSIS  Sometimes, other problems may cause a rash that looks like a viral exanthem. Most often, your caregiver can determine whether you have a viral exanthem by looking at the rash. They usually have distinct  patterns or appearance. Lab work may be done if the diagnosis is uncertain. Sometimes, a small tissue sample (biopsy) of the rash may need to be taken. TREATMENT  Immunizations have led to a decrease in the number of cases of measles, mumps, and rubella. Viral exanthems may require clinical treatment if a bacterial infection or other problems follow. The rash may be associated with:  Minor sore throat.  Aches and pains.  Runny nose.  Watery eyes.  Tiredness.  Some coughs.  Gastrointestinal infections causing nausea, vomiting, and diarrhea. Viral exanthems do not respond to antibiotic medicines, because they are not caused by bacteria. HOME CARE INSTRUCTIONS   Only take over-the-counter or prescription medicines for pain, discomfort, diarrhea, or fever as directed by your caregiver.  Drink enough water and fluids to keep your urine clear or pale yellow. SEEK MEDICAL CARE IF:  You develop swollen neck glands. This may feel like lumps or bumps in the neck.  You develop tenderness over your sinuses.  You are not feeling partly better after 3 days.  You develop muscle aches.  You are feeling more tired than you would expect.  You get a persistent cough with mucus. SEEK IMMEDIATE MEDICAL CARE IF:   You have a fever.  You develop red eyes or eye pain.  You develop sores in your mouth and difficulty drinking or eating.  You develop a sore throat with pus and difficulty swallowing.  You develop neck pain or a stiff neck.  You develop a severe headache.  You develop vomiting that will not stop. Document Released: 12/24/2002 Document Revised: 12/26/2011 Document Reviewed: 12/21/2010 [facility] Patient Information 2015 [place], [place]. This information is not intended to replace advice given to you by your health care provider. Make sure you discuss any questions you have with your health care provider.   Emergency Department Resource Guide 1) Find a Doctor and Pay Out of  Pocket Although you won't have to find out who is covered by your insurance plan, it is a good idea to ask around and get recommendations. You will then need to call the office and see if the doctor you have chosen will accept you as a new patient and what types of options they offer for patients who are self-pay. Some doctors offer discounts or will set up payment plans for their patients who do not have insurance, but you will need to ask so you aren't surprised when you get to your appointment.  2) Contact Your Local Health Department Not all health departments have doctors that can see patients for sick visits, but many do, so it is worth a call to see if yours does. If you don't know where your local health department is, you can check in your phone book. The CDC also has a tool to help you locate your state's health department, and many state websites also have listings of all of their local health departments.  3) Find a Walk-in Clinic If your illness is not likely to be very severe or complicated, you may want to try a walk in clinic. These are popping up all over the country in pharmacies, drugstores, and shopping centers. They're usually staffed by nurse practitioners or physician assistants that have been trained to treat common illnesses and complaints. They're usually fairly quick and inexpensive. However, if you have serious medical issues or chronic medical problems, these are probably not your best option.  No Primary Care Doctor: - Call Health Connect at  [phone] - they can help you locate a primary care doctor that  accepts your insurance, provides certain services, etc. - Physician Referral Service- [phone]  Chronic Pain Problems: Organization         Address  Phone   Notes  [facility]  [phone] Patients need to be referred by their primary care doctor.   Medication Assistance: Organization         Address  Phone   Notes  [facility]  Medication [facility] [address] [place], [place] [address] [phone]) [phone] --Must be a resident of [place] [facility] -- Must have NO insurance coverage whatsoever (no Medicaid/ Medicare, etc.) -- The pt. MUST have a primary care doctor that directs their care regularly and follows them in the community   [facility]  [phone]   [employer]  [phone]    Agencies that provide inexpensive medical care: Organization         Address  Phone   Notes  [facility]  [phone]   [facility]    [phone]   [facility] [address] [place], [place] [address] [phone]) [phone]   [facility] [address], [place] [phone]) [phone]   [facility]    [phone]   [facility]    [phone]   [facility]  [address], [place] Phone:  [phone], Fax:  [phone] Hours of Operation:  9 am - 6 pm, M-F.  Also accepts Medicaid/Medicare and self-pay.  [facility]  [address], [place] Phone: [phone], Fax: [phone]. Hours of Operation:  8:30 am - 5:30 pm, M-F.  Also accepts Medicaid and self-pay.  [facility] [place] [address], [place] Phone: [phone]   [facility] [address] Natasha Bence [place], [place] [phone], Ext. [phone] Mondays & Thursdays: 7-9 AM.  First 15 patients are seen on a first come, first serve basis.    Medicaid-accepting [facility] Providers:  Organization         Address  Phone   Notes  [facility] [address], [place] [phone] Also accepts self-pay patients.  [facility] [address] Laurell Josephs [place], [place]  [phone]   [facility] [address], [place] [phone]   [facility] [address] [place] [phone]   Renaye Rakers [address], [place]   [phone] Only accepts [place] Access [place] patients after they have their name applied to their card.   Self-Pay (no insurance) in [facility]:  Organization         Address  Phone   Notes  Sickle Cell Patients, [facility] Internal Medicine [address] [place], [place] [phone]   [facility] Urgent Care [address] [place], [place] [phone]   [facility]  [address], [place] [phone]   [facility]/Dr. Osei-Bonsu  [address] [place] or [address], [place] [phone] Phone number for both [place] and [place] locations is the same.  [facility]gent Medical and [facility] [address] [place] [phone]   [facility] [address] [place] or [address] [phone]   [facility] [address], [place] [phone], phone; [phone], fax Sees patients 1st and 3rd Saturday of every month.  Must not qualify for public or private insurance (i.e. Medicaid, Medicare, [place] Health Choice, Veterans' Benefits)  Household income should be no more than 200% of the poverty level The clinic cannot treat you if you are pregnant or think you are pregnant  Sexually transmitted diseases are not treated at the clinic.    Dental Care: Organization         Address  Phone  Notes  [facility] Department of [facility] [address] [place], [place] [phone] Accepts children up to age 96 who are enrolled in [place] or [place] Health Choice; pregnant women with a Medicaid card; and children who have applied for Medicaid or [place] Health Choice, but were declined, whose parents can pay a reduced fee at time of service.  [facility] Department of [facility]  [address], [place]  [phone] Accepts children up to age 11 who are enrolled in [place] or [place] Health Choice; pregnant women with a Medicaid card; and children who have applied for Medicaid or [place] Health Choice, but were declined, whose parents can pay a reduced fee at time of service.  [facility]  [address] [place], [place] [phone] Patients are seen by appointment only. Walk-ins are not accepted. [facility] will see patients 32 years of age and older. Monday - Tuesday (8am-5pm) Most Wednesdays (8:30-5pm) $30 per visit, cash only  [facility]  [address], [facility] [phone] Patients are seen by appointment only. Walk-ins are not accepted. [facility] will see patients 35 years of age and older. One Wednesday Evening (Monthly: Volunteer Based).  $30 per visit, cash only  [employer]  [phone] for adults; Children under age 17, call Graduate Pediatric Dentistry at [phone]. Children aged 84-14, please call [phone] to request a pediatric application.  Dental services are provided in all areas of dental care including fillings, crowns and bridges, complete and partial dentures, implants, gum treatment, root canals, and extractions. Preventive care is also provided. Treatment is provided to both adults and children. Patients are selected via a lottery and there is often a waiting list.   [facility] [address], [place]  [phone] [email or web address]   [facility] [address] [place], [place] [phone], Ext. [phone] Second and Fourth Thursday of each month, opens at 6:30 AM; Clinic ends at 9 AM.  Patients are seen on a first-come first-served basis, and a limited number are seen during each clinic.   [facility]  [address] Ether Griffins [place], [place] [phone]   Eligibility Requirements You must have lived in [place], [place], or [place]  counties for at least the last three months.   You cannot be eligible for state or federal sponsored [place], including [employer], [place], or [employer].   You generally cannot be eligible for healthcare insurance through your employer.    How to apply: Eligibility screenings are held every Tuesday and Wednesday afternoon from 1:00 pm until 4:00 pm. You do not need an appointment for the interview!  [facility] [address] [place], [place] [phone]   [facility] Health Department  [phone]   [facility] Health Department  [phone]   [facility] Health Department  [phone]    Behavioral Health Resources in the Community: Intensive Outpatient Programs Organization         Address  Phone  Notes  [facility] [address] [place], [place] [phone]   [facility] Outpatient [address] [place], [place] [phone]   [facility] [address] [place], [place]  [phone]   [facility] Mental Health [address]  [place], [place] [phone] or [phone]   Substance Abuse Resources Organization         Address  Phone  Notes  [facility]  [phone]   [facility]  [phone]   [facility] [place]  [phone]   Floydene Flock  [phone]   Residential & Outpatient Substance Abuse Program  [phone]   Psychological Services Organization         Address  Phone  Notes  [facility] Behavioral Health  [phone]   [facility] Services  [phone]   [facility] Mental Health [address] [place] [phone] or [phone]    Mobile Crisis Teams Organization         Address  Phone  Notes  [facility], Mobile Crisis Care Unit  [phone]   [facility]  [address] [place], [place] [phone]   [facility] [address] [place]  [place] [phone]    Self-Help/Support Groups Organization  Address  Phone             Notes  [facility] [place] - variety of support groups  336- I7437963 Call for more information  Narcotics Anonymous (NA), [facility] [address], [employer] [place]  2 meetings at this location   Statistician         Address  Phone  Notes  [facility] [address],    [place]  [phone]   [facility]  [address], [place] [address], [place], [place] [phone]   [facility] [address] [place], [place] [phone] Admissions: 8am-3pm M-F  [facility] [address],    [place], [place] [phone]   [facility] [address] [place], [place], [place] [phone]   [facility] [address]  [place], [place] [phone]   [facility] - Intensive Outpatient [address] Laurell Josephs [address], [place], [place] [phone]   [facility] ([facility] [address] [place].,  [place], [place] [phone] or [phone]   [facility] ([facility]) [address] [place], [place] [phone] Accepts Medicaid  [facility] [address]  [place] [phone] Substance Abuse/Addiction Treatment   [facility] Organization         Address  Phone  Notes  [facility]  [phone]   Angie Fava, PhD [address] Ervin Knack [place], [place]   ([phone] or [phone]   [facility]   [address] [place], [place] [phone]   [facility] [address], [place], [place] [phone] Insurance/Medicaid/sponsorship through [facility] and Families [address]                                    [place], [place] [phone] Therapy/tele-psych/case  [facility] [address], [place] [phone]    Dr. Lolly Mustache  [phone]   [facility]  [employer] [facility] Dept. 1) [address], [place] 2) [address], [place] 3)  [address], [place] [phone]  [phone]   [facility] Child Abuse Hotline [phone] or [phone] (After Hours)

## 2015-02-25 NOTE — ED Provider Notes (Signed)
CSN: [id number]     Arrival date & time 02/25/15  40 History   First MD Initiated Contact with Patient 02/25/15 1638     Chief Complaint  Patient presents with  . Rash   Patient is a 24 y.o. female presenting with rash. The history is provided by the patient.  Rash Associated symptoms: no diarrhea, no nausea, no shortness of breath, no sore throat and not vomiting   This chart was scribed for non-physician practitioner Jamse Mead, PA-C, working with Wandra Arthurs, MD, by Thea Alken, ED Scribe. This patient was seen in room WTR5/WTR5 and the patient's care was started at 6:17 PM.  Belinda Salas is a 24 y.o. female with hx of eczema and GERD who presents to the Emergency Department complaining of a rash. Patient reported that the rash started approximately 4 days ago colitis mouth. Patient reported that she thought that this was acne and decided to leave it alone. Patient reported that 3 days ago she noticed that the rash went widespread. Reported that it started to affect her face, and whole-body. Reported that 2 days ago the rash is gone parsley worse. Patient reported that yesterday she was seen and assessed in the ED at [facility] where she was started on Bactrim and topical clindamycin. Reported that yesterday she started to notice itching once using the medications. Patient reported that she is guarding had chickenpox in the past. Denied fever, drainage, fluid drainage, sore throat, throat closing sensation, tongue swelling, blurred vision, sudden loss of vision, neck pain, neck stiffness, abdominal pain, nausea, vomiting, diarrhea, chest pain, shortness of breath, difficulty breathing, red streaks, travel, friends/family having similar symptoms. Denied changes to hair products/detergents/soaps/creams/makeup/clothing/foods. PCP none  Past Medical History  Diagnosis Date  . Acid reflux   . Spasm of esophagus   . Eczema    History reviewed. No pertinent past surgical history. No  family history on file. History  Substance Use Topics  . Smoking status: Current Some Day Smoker    Types: Cigarettes  . Smokeless tobacco: Not on file  . Alcohol Use: No   OB History    No data available     Review of Systems  HENT: Negative for sore throat and trouble swallowing.   Respiratory: Negative for shortness of breath.   Cardiovascular: Negative for chest pain.  Gastrointestinal: Negative for nausea, vomiting and diarrhea.  Musculoskeletal: Negative for neck pain and neck stiffness.  Skin: Positive for rash.   Allergies  Naproxen; Penicillins; Percocet; and Acrylic polymer  Home Medications   Prior to Admission medications   Medication Sig Start Date End Date Taking? Authorizing Provider  aspirin-acetaminophen-caffeine (EXCEDRIN MIGRAINE) [phone] MG per tablet Take 2 tablets by mouth every 6 (six) hours as needed for headache.   Yes Historical Provider, MD  clindamycin (CLINDAGEL) 1 % gel Apply topically 2 (two) times daily. Patient taking differently: Apply 1 application topically 2 (two) times daily.  02/24/15  Yes Kaitlyn Szekalski, PA-C  diphenhydrAMINE (SOMINEX) 25 MG tablet Take 25 mg by mouth daily as needed for sleep (allergic reaction).   Yes Historical Provider, MD  famotidine (PEPCID) 20 MG tablet Take 1 tablet (20 mg total) by mouth 2 (two) times daily. Patient taking differently: Take 20 mg by mouth 2 (two) times daily as needed for heartburn.  04/30/14  Yes Azaryah Oleksy, PA-C  Melatonin 3 MG CAPS Take 3 mg by mouth at bedtime as needed (sleep).   Yes Historical Provider, MD  omeprazole (PRILOSEC) 20  MG capsule Take 20 mg by mouth as needed (GERD).   Yes Historical Provider, MD  ORTHO TRI-CYCLEN LO 0.18/0.215/0.25 MG-25 MCG tab Take 1 tablet by mouth daily. 02/13/15  Yes Historical Provider, MD  sulfamethoxazole-trimethoprim (BACTRIM DS,SEPTRA DS) 800-160 MG per tablet Take 1 tablet by mouth 2 (two) times daily. 02/24/15 03/03/15 Yes Kaitlyn Szekalski,  PA-C  predniSONE (DELTASONE) 20 MG tablet Please take 3 tablets PO (total of 60 mg) for 3 days, followed by 2 tablets PO (total of 40 mg) for 2 days, followed by 1 tablet PO (total of 20 mg) for 2 days. 02/25/15   Janiel Derhammer, PA-C   BP 118/69 mmHg  Pulse 71  Temp(Src) 98.5 F (36.9 C)  Resp 13  SpO2 100% Physical Exam  Constitutional: She is oriented to person, place, and time. She appears well-developed and well-nourished. No distress.  HENT:  Head: Normocephalic and atraumatic.  Mouth/Throat: Oropharynx is clear and moist. No oropharyngeal exudate.  Negative angioedema Negative tongue swelling  Eyes: Conjunctivae and EOM are normal. Pupils are equal, round, and reactive to light. Right eye exhibits no discharge. Left eye exhibits no discharge.  Neck: Normal range of motion. Neck supple.  Cardiovascular: Normal rate, regular rhythm and normal heart sounds.  Exam reveals no friction rub.   No murmur heard. Pulmonary/Chest: Effort normal and breath sounds normal. No respiratory distress. She has no wheezes. She has no rales.  Patient is able to speak in full sentences without difficulty Negative use of accessory muscles Negative stridor  Musculoskeletal: Normal range of motion.  Neurological: She is alert and oriented to person, place, and time. No cranial nerve deficit. She exhibits normal muscle tone. Coordination normal. GCS eye subscore is 4. GCS verbal subscore is 5. GCS motor subscore is 6.  Skin: Skin is warm and dry. Rash noted.  Papular rash scattered throughout the entire body affecting the face, chest, torso, back, extremities circumferentially. Does not appear to be affecting the pons and hands and soles of the feet. Negative active drainage or bleeding. Negative halos. Negative surrounding erythema or red streaks.  Psychiatric: She has a normal mood and affect. Her behavior is normal.  Nursing note and vitals reviewed.   ED Course  Procedures (including critical care  time) DIAGNOSTIC STUDIES: Oxygen Saturation is 100% on RA, normal by my interpretation.    COORDINATION OF CARE: 6:17 PM- Pt advised of plan for treatment and pt agrees.  Results for orders placed or performed during the hospital encounter of 02/25/15  POC urine preg, ED (not at [facility])  Result Value Ref Range   Preg Test, Ur NEGATIVE NEGATIVE    Labs Review Labs Reviewed  POC URINE PREG, ED    Imaging Review No results found.   EKG Interpretation None       5:46 PM Dr. Darl Householder at bedside assessing patient. As per attending physician recommended Solu-Medrol IM to be administered in ED setting. Recommended patient to have Bactrim and clindamycin topical gel discontinued. Recommended patient be started on a seven-day course of tapering prednisone-60 mg for 3 days, 40 mg for 2 days and 20 mg for 2 days. Recommended patient to be followed by her primary care provider and recommended to follow-up with dermatology.  MDM   Final diagnoses:  Viral exanthem  Dermatitis    Medications  methylPREDNISolone sodium succinate (SOLU-MEDROL) 125 mg/2 mL injection 60 mg (not administered)    Filed Vitals:   02/25/15 1541  BP: 118/69  Pulse: 71  Temp: 98.5  F (36.9 C)  Resp: 13  SpO2: 100%   This provider reviewed patient's chart. Patient was seen and assessed in ED setting yesterday, 02/24/2015 per patient was diagnosed with acne infection treated with Bactrim and topical clindamycin. Urine pregnancy negative. Doubt erythema multiforme major and minor. Doubt SJS. Doubt herpes zoster. Definitive etiology of rash unknown, cannot rule out possible viral exanthem versus contact dermatitis. Patient seen and assessed by attending physician, Dr. Darl Householder, who recommends patient to be started on prednisone tapering dose for 7 days. Patient stable, afebrile. Patient not septic appearing. Negative signs of respiratory distress. Negative signs of anaphylactic reaction. Discharged patient. Discussed with  patient to discontinue Bactrim and clindamycin gel. Discussed with patient to start prednisone. [facility] and dermatologist. Discussed with patient to closely monitor symptoms and if symptoms are to worsen or change to report back to the ED - strict return instructions given.  Patient agreed to plan of care, understood, all questions answered.    Jamse Mead, PA-C 02/25/15 [place], PA-C 02/25/15 1834  Wandra Arthurs, MD 02/28/15 [phone]

## 2016-06-01 IMAGING — US US ABDOMEN COMPLETE
1 series · 14 of 25 positions shown · non-contrast
Comparison: None.

CLINICAL DATA: Right upper quadrant and epigastric discomfort

EXAM:
ULTRASOUND ABDOMEN COMPLETE

[Series 1: us abdomen complete · 0.17mm/px · 14 of 76 slices shown]
[im 1/76]
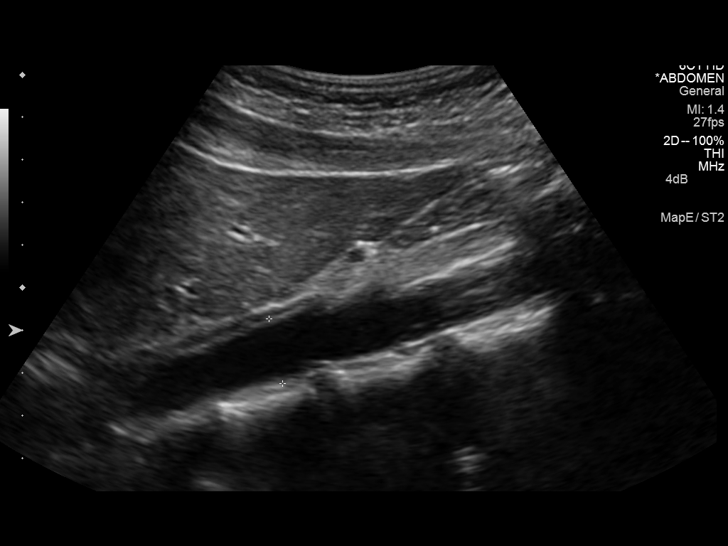
[im 7/76]
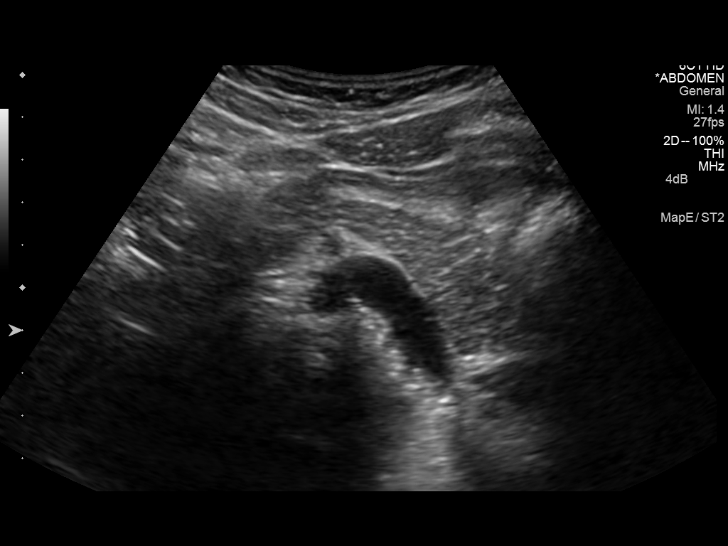
[im 13/76]
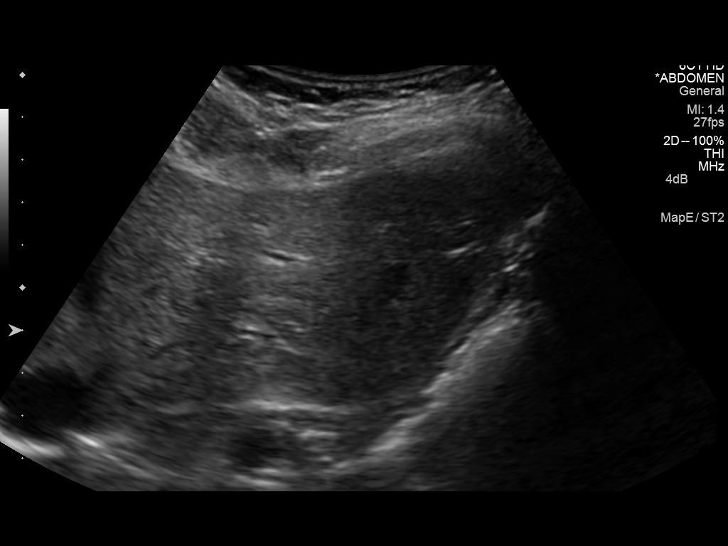
[im 19/76]
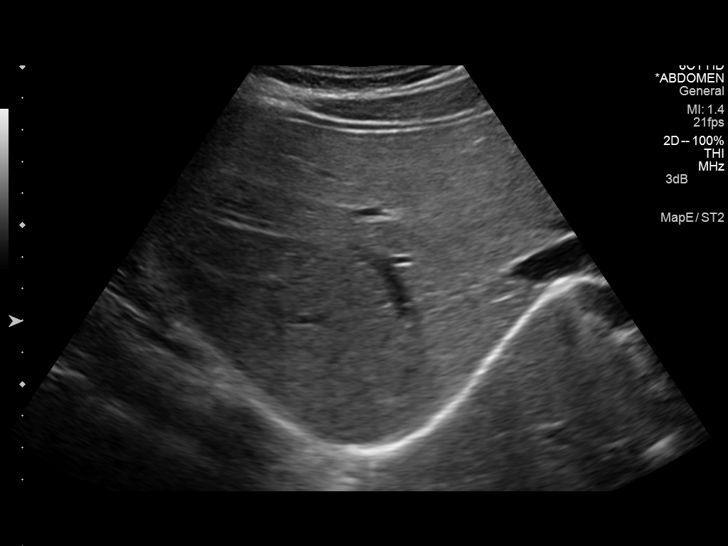
[im 26/76]
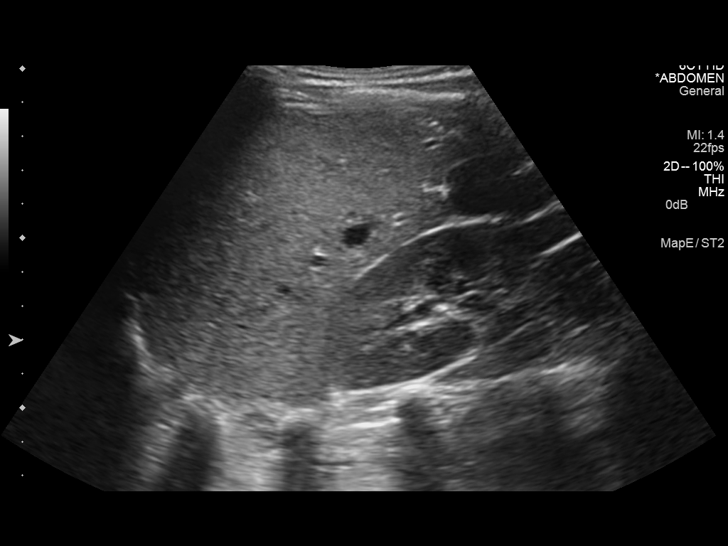
[im 29/76]
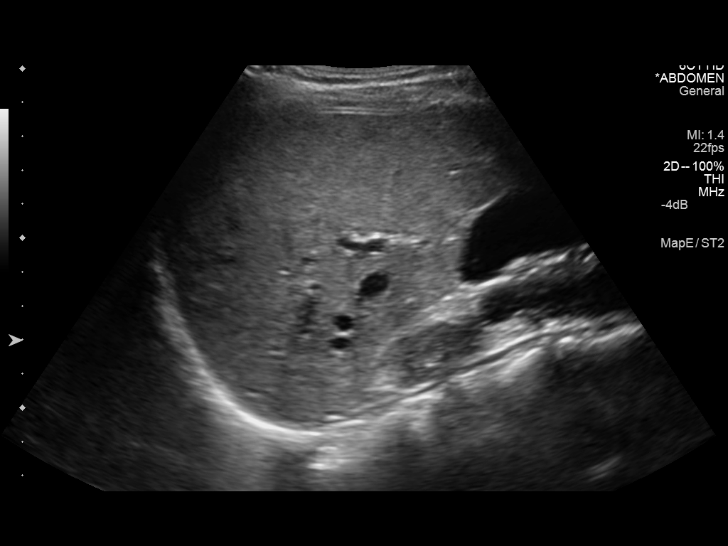
[im 35/76]
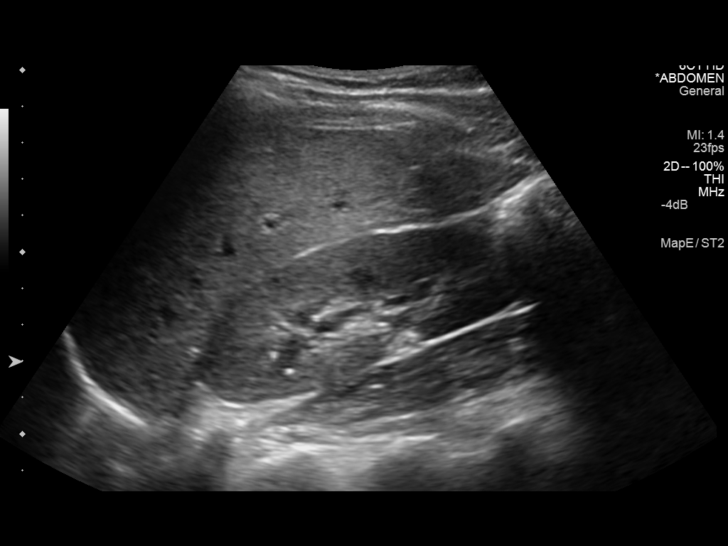
[im 41/76]
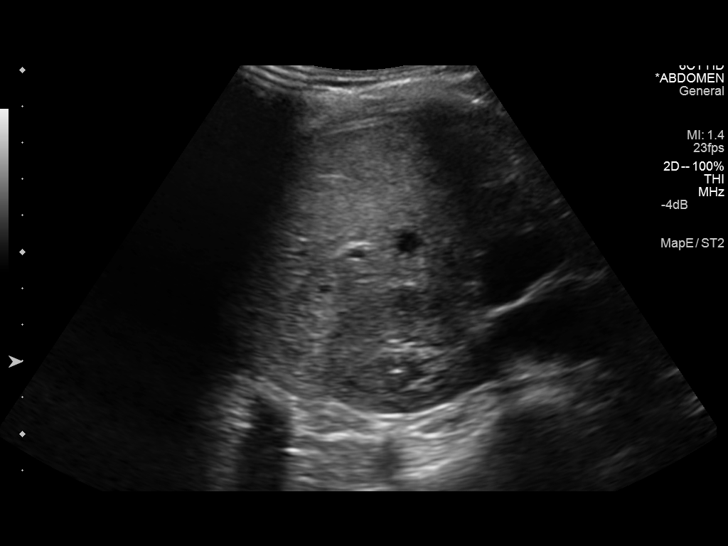
[im 47/76]
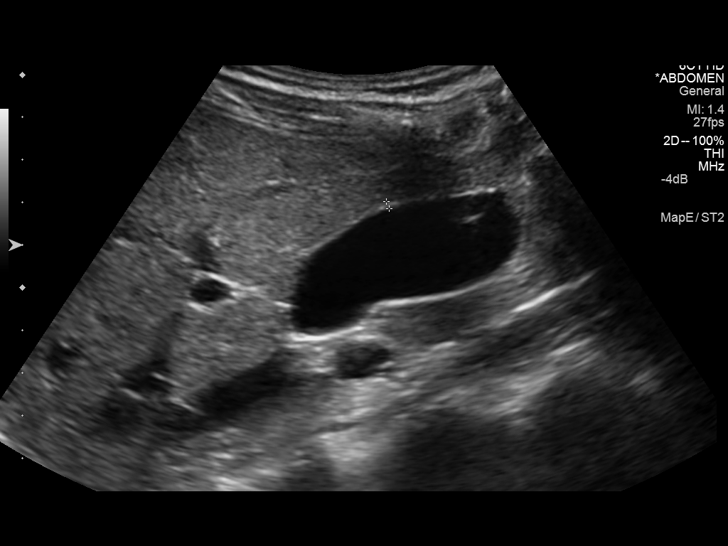
[im 51/76]
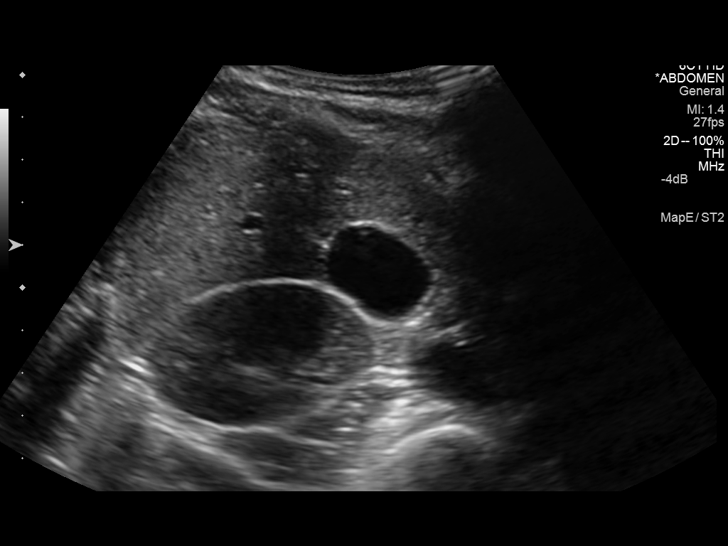
[im 57/76]
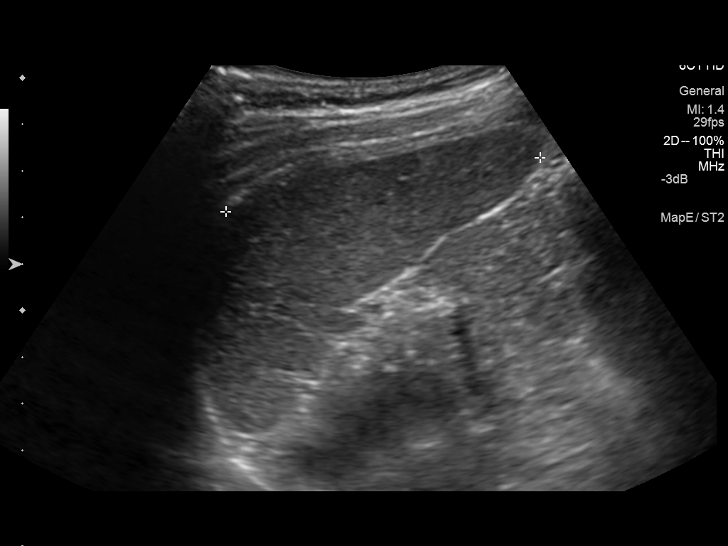
[im 63/76]
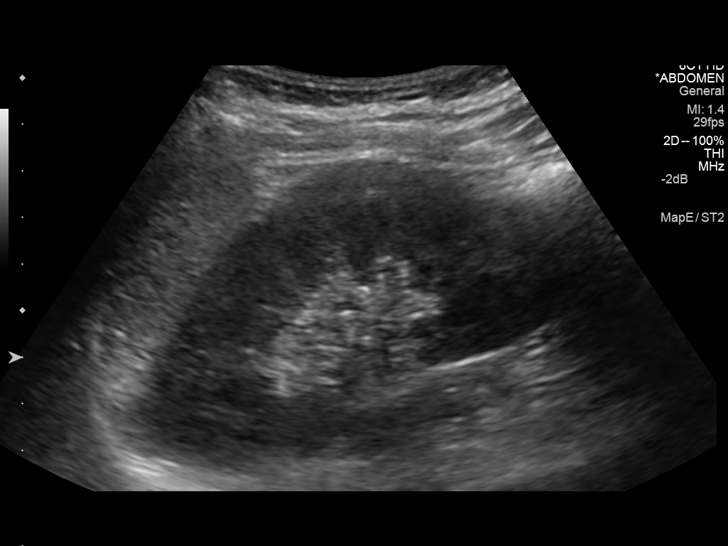
[im 69/76]
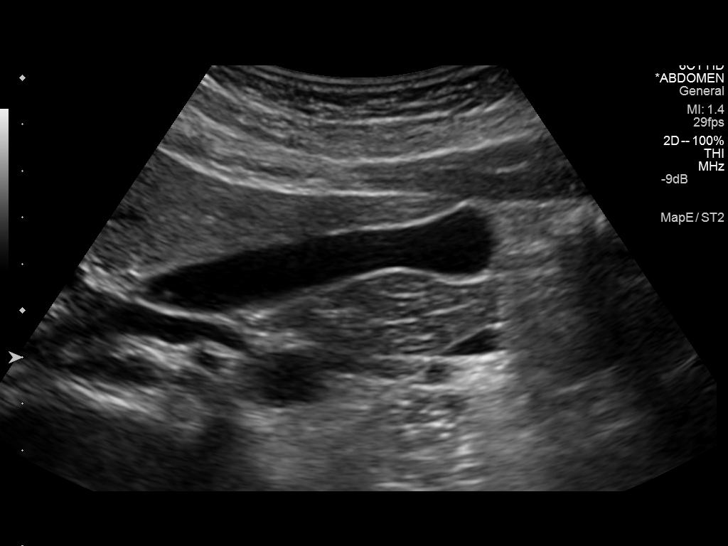
[im 76/76]
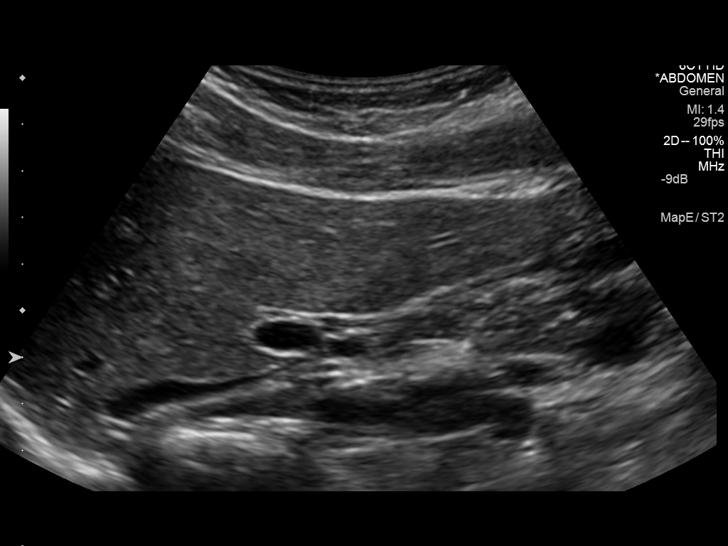

[14 of 25 positions shown; findings below may reference images not displayed]

FINDINGS: Gallbladder:

No gallstones or wall thickening visualized. No sonographic Murphy
sign noted.

Common bile duct:

Diameter: 2.6 mm

Liver:

No focal lesion identified. Within normal limits in parenchymal
echogenicity.

IVC:

No abnormality visualized.

Pancreas:

Visualized portion unremarkable.

Spleen:

Size and appearance within normal limits.

Right Kidney:

Length: 9.5 cm. Echogenicity within normal limits. No mass or
hydronephrosis visualized.

Left Kidney:

Length: 9.4 cm. Echogenicity within normal limits. No mass or
hydronephrosis visualized.

Abdominal aorta:

No aneurysm visualized.

Other findings:

There is no ascites.
IMPRESSION: Normal abdominal ultrasound examination.
